# Patient Record
Sex: Male | Born: 1945 | Race: White | Hispanic: No | Marital: Single
Health system: Southern US, Community
[De-identification: ages and names within clinical notes are randomized; demographics above are authoritative.]

## PROBLEM LIST (undated history)

## (undated) DIAGNOSIS — C439 Malignant melanoma of skin, unspecified: Secondary | ICD-10-CM

## (undated) DIAGNOSIS — Z8719 Personal history of other diseases of the digestive system: Secondary | ICD-10-CM

## (undated) DIAGNOSIS — F319 Bipolar disorder, unspecified: Secondary | ICD-10-CM

## (undated) HISTORY — DX: Malignant melanoma of skin, unspecified: C43.9

## (undated) HISTORY — PX: CATARACT EXTRACTION: SUR2

## (undated) HISTORY — PX: KNEE SURGERY: SHX244

---

## 2006-04-10 ENCOUNTER — Encounter: Admission: RE | Admit: 2006-04-10 | Discharge: 2006-04-10 | Payer: Self-pay | Admitting: Family Medicine

## 2014-10-14 ENCOUNTER — Other Ambulatory Visit: Payer: Self-pay | Admitting: Family Medicine

## 2014-10-14 DIAGNOSIS — Z139 Encounter for screening, unspecified: Secondary | ICD-10-CM

## 2014-10-19 ENCOUNTER — Ambulatory Visit
Admission: RE | Admit: 2014-10-19 | Discharge: 2014-10-19 | Disposition: A | Discharge status: Home / Self Care | Payer: Medicare Other | Source: Ambulatory Visit | Attending: Family Medicine | Admitting: Family Medicine

## 2014-10-19 DIAGNOSIS — Z139 Encounter for screening, unspecified: Secondary | ICD-10-CM

## 2017-07-27 DIAGNOSIS — H02431 Paralytic ptosis of right eyelid: Secondary | ICD-10-CM | POA: Diagnosis not present

## 2017-07-27 DIAGNOSIS — H02432 Paralytic ptosis of left eyelid: Secondary | ICD-10-CM | POA: Diagnosis not present

## 2017-08-01 DIAGNOSIS — H02834 Dermatochalasis of left upper eyelid: Secondary | ICD-10-CM | POA: Diagnosis not present

## 2017-08-01 DIAGNOSIS — H02831 Dermatochalasis of right upper eyelid: Secondary | ICD-10-CM | POA: Diagnosis not present

## 2017-08-06 DIAGNOSIS — G4700 Insomnia, unspecified: Secondary | ICD-10-CM | POA: Diagnosis not present

## 2017-08-06 DIAGNOSIS — D509 Iron deficiency anemia, unspecified: Secondary | ICD-10-CM | POA: Diagnosis not present

## 2017-08-06 DIAGNOSIS — I10 Essential (primary) hypertension: Secondary | ICD-10-CM | POA: Diagnosis not present

## 2017-08-06 DIAGNOSIS — R69 Illness, unspecified: Secondary | ICD-10-CM | POA: Diagnosis not present

## 2017-08-06 DIAGNOSIS — E7800 Pure hypercholesterolemia, unspecified: Secondary | ICD-10-CM | POA: Diagnosis not present

## 2017-08-06 DIAGNOSIS — Z79899 Other long term (current) drug therapy: Secondary | ICD-10-CM | POA: Diagnosis not present

## 2017-08-29 DIAGNOSIS — Z01818 Encounter for other preprocedural examination: Secondary | ICD-10-CM | POA: Diagnosis not present

## 2017-08-29 DIAGNOSIS — I10 Essential (primary) hypertension: Secondary | ICD-10-CM | POA: Diagnosis not present

## 2017-08-29 DIAGNOSIS — Z419 Encounter for procedure for purposes other than remedying health state, unspecified: Secondary | ICD-10-CM | POA: Diagnosis not present

## 2017-09-03 DIAGNOSIS — H02831 Dermatochalasis of right upper eyelid: Secondary | ICD-10-CM | POA: Diagnosis not present

## 2017-09-03 DIAGNOSIS — H02834 Dermatochalasis of left upper eyelid: Secondary | ICD-10-CM | POA: Diagnosis not present

## 2017-09-03 DIAGNOSIS — H53453 Other localized visual field defect, bilateral: Secondary | ICD-10-CM | POA: Diagnosis not present

## 2017-10-03 DIAGNOSIS — H401131 Primary open-angle glaucoma, bilateral, mild stage: Secondary | ICD-10-CM | POA: Diagnosis not present

## 2017-12-27 DIAGNOSIS — R69 Illness, unspecified: Secondary | ICD-10-CM | POA: Diagnosis not present

## 2018-01-11 DIAGNOSIS — S80212A Abrasion, left knee, initial encounter: Secondary | ICD-10-CM | POA: Diagnosis not present

## 2018-01-15 DIAGNOSIS — M25562 Pain in left knee: Secondary | ICD-10-CM | POA: Diagnosis not present

## 2018-01-15 DIAGNOSIS — M170 Bilateral primary osteoarthritis of knee: Secondary | ICD-10-CM | POA: Diagnosis not present

## 2018-01-15 DIAGNOSIS — M1711 Unilateral primary osteoarthritis, right knee: Secondary | ICD-10-CM | POA: Diagnosis not present

## 2018-01-15 DIAGNOSIS — M25561 Pain in right knee: Secondary | ICD-10-CM | POA: Diagnosis not present

## 2018-01-15 DIAGNOSIS — S80212A Abrasion, left knee, initial encounter: Secondary | ICD-10-CM | POA: Diagnosis not present

## 2018-01-15 DIAGNOSIS — M1712 Unilateral primary osteoarthritis, left knee: Secondary | ICD-10-CM | POA: Diagnosis not present

## 2018-01-29 DIAGNOSIS — R296 Repeated falls: Secondary | ICD-10-CM | POA: Diagnosis not present

## 2018-01-29 DIAGNOSIS — G4700 Insomnia, unspecified: Secondary | ICD-10-CM | POA: Diagnosis not present

## 2018-02-06 DIAGNOSIS — H401131 Primary open-angle glaucoma, bilateral, mild stage: Secondary | ICD-10-CM | POA: Diagnosis not present

## 2018-02-12 DIAGNOSIS — E7800 Pure hypercholesterolemia, unspecified: Secondary | ICD-10-CM | POA: Diagnosis not present

## 2018-02-12 DIAGNOSIS — Z23 Encounter for immunization: Secondary | ICD-10-CM | POA: Diagnosis not present

## 2018-02-12 DIAGNOSIS — I10 Essential (primary) hypertension: Secondary | ICD-10-CM | POA: Diagnosis not present

## 2018-02-12 DIAGNOSIS — Z0001 Encounter for general adult medical examination with abnormal findings: Secondary | ICD-10-CM | POA: Diagnosis not present

## 2018-02-12 DIAGNOSIS — D509 Iron deficiency anemia, unspecified: Secondary | ICD-10-CM | POA: Diagnosis not present

## 2018-02-12 DIAGNOSIS — R55 Syncope and collapse: Secondary | ICD-10-CM | POA: Diagnosis not present

## 2018-02-12 DIAGNOSIS — G4700 Insomnia, unspecified: Secondary | ICD-10-CM | POA: Diagnosis not present

## 2018-02-13 ENCOUNTER — Other Ambulatory Visit (HOSPITAL_COMMUNITY): Payer: Self-pay | Admitting: Family Medicine

## 2018-02-13 DIAGNOSIS — R55 Syncope and collapse: Secondary | ICD-10-CM

## 2018-02-19 ENCOUNTER — Other Ambulatory Visit: Payer: Self-pay

## 2018-02-19 ENCOUNTER — Ambulatory Visit (HOSPITAL_COMMUNITY): Payer: Medicare HMO | Attending: Cardiovascular Disease

## 2018-02-19 DIAGNOSIS — I10 Essential (primary) hypertension: Secondary | ICD-10-CM | POA: Insufficient documentation

## 2018-02-19 DIAGNOSIS — R55 Syncope and collapse: Secondary | ICD-10-CM | POA: Diagnosis not present

## 2018-02-19 DIAGNOSIS — I340 Nonrheumatic mitral (valve) insufficiency: Secondary | ICD-10-CM | POA: Diagnosis not present

## 2018-02-19 DIAGNOSIS — E785 Hyperlipidemia, unspecified: Secondary | ICD-10-CM | POA: Diagnosis not present

## 2018-04-27 DIAGNOSIS — J069 Acute upper respiratory infection, unspecified: Secondary | ICD-10-CM | POA: Diagnosis not present

## 2018-06-26 DIAGNOSIS — H401131 Primary open-angle glaucoma, bilateral, mild stage: Secondary | ICD-10-CM | POA: Diagnosis not present

## 2018-07-29 DIAGNOSIS — R69 Illness, unspecified: Secondary | ICD-10-CM | POA: Diagnosis not present

## 2018-08-15 DIAGNOSIS — D509 Iron deficiency anemia, unspecified: Secondary | ICD-10-CM | POA: Diagnosis not present

## 2018-08-15 DIAGNOSIS — G4700 Insomnia, unspecified: Secondary | ICD-10-CM | POA: Diagnosis not present

## 2018-08-15 DIAGNOSIS — I10 Essential (primary) hypertension: Secondary | ICD-10-CM | POA: Diagnosis not present

## 2018-08-15 DIAGNOSIS — R69 Illness, unspecified: Secondary | ICD-10-CM | POA: Diagnosis not present

## 2018-08-15 DIAGNOSIS — E7800 Pure hypercholesterolemia, unspecified: Secondary | ICD-10-CM | POA: Diagnosis not present

## 2018-10-23 DIAGNOSIS — H401131 Primary open-angle glaucoma, bilateral, mild stage: Secondary | ICD-10-CM | POA: Diagnosis not present

## 2019-02-05 DIAGNOSIS — R69 Illness, unspecified: Secondary | ICD-10-CM | POA: Diagnosis not present

## 2019-02-27 DIAGNOSIS — Z0001 Encounter for general adult medical examination with abnormal findings: Secondary | ICD-10-CM | POA: Diagnosis not present

## 2019-02-27 DIAGNOSIS — I10 Essential (primary) hypertension: Secondary | ICD-10-CM | POA: Diagnosis not present

## 2019-02-27 DIAGNOSIS — R69 Illness, unspecified: Secondary | ICD-10-CM | POA: Diagnosis not present

## 2019-02-27 DIAGNOSIS — Z79899 Other long term (current) drug therapy: Secondary | ICD-10-CM | POA: Diagnosis not present

## 2019-02-27 DIAGNOSIS — Z113 Encounter for screening for infections with a predominantly sexual mode of transmission: Secondary | ICD-10-CM | POA: Diagnosis not present

## 2019-02-27 DIAGNOSIS — Z23 Encounter for immunization: Secondary | ICD-10-CM | POA: Diagnosis not present

## 2019-02-27 DIAGNOSIS — E7800 Pure hypercholesterolemia, unspecified: Secondary | ICD-10-CM | POA: Diagnosis not present

## 2019-02-27 DIAGNOSIS — G4700 Insomnia, unspecified: Secondary | ICD-10-CM | POA: Diagnosis not present

## 2019-03-07 ENCOUNTER — Other Ambulatory Visit: Payer: Self-pay | Admitting: *Deleted

## 2019-03-07 DIAGNOSIS — R6889 Other general symptoms and signs: Secondary | ICD-10-CM | POA: Diagnosis not present

## 2019-03-07 DIAGNOSIS — Z20822 Contact with and (suspected) exposure to covid-19: Secondary | ICD-10-CM

## 2019-03-08 LAB — NOVEL CORONAVIRUS, NAA: SARS-CoV-2, NAA: NOT DETECTED

## 2019-03-10 ENCOUNTER — Telehealth: Payer: Self-pay | Admitting: Family Medicine

## 2019-03-10 NOTE — Telephone Encounter (Signed)
Negative COVID results given. Patient results "NOT Detected." Caller expressed understanding. ° °

## 2019-03-25 DIAGNOSIS — H401131 Primary open-angle glaucoma, bilateral, mild stage: Secondary | ICD-10-CM | POA: Diagnosis not present

## 2019-03-25 DIAGNOSIS — Z961 Presence of intraocular lens: Secondary | ICD-10-CM | POA: Diagnosis not present

## 2019-04-21 DIAGNOSIS — H9202 Otalgia, left ear: Secondary | ICD-10-CM | POA: Diagnosis not present

## 2019-05-12 ENCOUNTER — Other Ambulatory Visit: Payer: Self-pay

## 2019-05-12 DIAGNOSIS — Z20822 Contact with and (suspected) exposure to covid-19: Secondary | ICD-10-CM

## 2019-05-13 LAB — NOVEL CORONAVIRUS, NAA: SARS-CoV-2, NAA: NOT DETECTED

## 2019-07-09 ENCOUNTER — Ambulatory Visit: Payer: Medicare Other | Attending: Internal Medicine

## 2019-07-09 DIAGNOSIS — Z23 Encounter for immunization: Secondary | ICD-10-CM | POA: Insufficient documentation

## 2019-07-09 NOTE — Progress Notes (Signed)
   Covid-19 Vaccination Clinic  Name:  Joseph Walls    MRN: [id number] DOB: 09-18-1945  07/09/2019  [name] was observed post Covid-19 immunization for 15 minutes without incidence. He was provided with Vaccine Information Sheet and instruction to access the V-Safe system.   [name] was instructed to call 911 with any severe reactions post vaccine: Marland Kitchen Difficulty breathing  . Swelling of your face and throat  . A fast heartbeat  . A bad rash all over your body  . Dizziness and weakness    Immunizations Administered    Name Date Dose VIS Date Route   Pfizer COVID-19 Vaccine 07/09/2019  2:09 PM 0.3 mL 05/30/2019 Intramuscular   Manufacturer: [employer], [employer]   Lot: [id number]   NDC: [address]-4

## 2019-07-29 DIAGNOSIS — H401131 Primary open-angle glaucoma, bilateral, mild stage: Secondary | ICD-10-CM | POA: Diagnosis not present

## 2019-07-30 ENCOUNTER — Ambulatory Visit: Payer: Medicare HMO | Attending: Internal Medicine

## 2019-07-30 DIAGNOSIS — Z23 Encounter for immunization: Secondary | ICD-10-CM | POA: Insufficient documentation

## 2019-07-30 NOTE — Progress Notes (Signed)
   Covid-19 Vaccination Clinic  Name:  Joseph Walls    MRN: [id number] DOB: [birth year]  07/30/2019  Joseph Walls was observed post Covid-19 immunization for 15 minutes without incidence. He was provided with Vaccine Information Sheet and instruction to access the V-Safe system.   Joseph Walls was instructed to call 911 with any severe reactions post vaccine: Marland Kitchen Difficulty breathing  . Swelling of your face and throat  . A fast heartbeat  . A bad rash all over your body  . Dizziness and weakness    Immunizations Administered    Name Date Dose VIS Date Route   Pfizer COVID-19 Vaccine 07/30/2019  2:12 PM 0.3 mL 05/30/2019 Intramuscular   Manufacturer: [employer], [employer]   Lot: [id number]   NDC: [address]-3

## 2019-08-13 DIAGNOSIS — R69 Illness, unspecified: Secondary | ICD-10-CM | POA: Diagnosis not present

## 2019-08-27 DIAGNOSIS — E7800 Pure hypercholesterolemia, unspecified: Secondary | ICD-10-CM | POA: Diagnosis not present

## 2019-08-27 DIAGNOSIS — R69 Illness, unspecified: Secondary | ICD-10-CM | POA: Diagnosis not present

## 2019-08-27 DIAGNOSIS — I10 Essential (primary) hypertension: Secondary | ICD-10-CM | POA: Diagnosis not present

## 2019-08-27 DIAGNOSIS — H00012 Hordeolum externum right lower eyelid: Secondary | ICD-10-CM | POA: Diagnosis not present

## 2019-08-27 DIAGNOSIS — G4700 Insomnia, unspecified: Secondary | ICD-10-CM | POA: Diagnosis not present

## 2019-08-27 DIAGNOSIS — D509 Iron deficiency anemia, unspecified: Secondary | ICD-10-CM | POA: Diagnosis not present

## 2019-09-03 DIAGNOSIS — Z1159 Encounter for screening for other viral diseases: Secondary | ICD-10-CM | POA: Diagnosis not present

## 2019-09-08 DIAGNOSIS — Z1211 Encounter for screening for malignant neoplasm of colon: Secondary | ICD-10-CM | POA: Diagnosis not present

## 2019-09-08 DIAGNOSIS — K5730 Diverticulosis of large intestine without perforation or abscess without bleeding: Secondary | ICD-10-CM | POA: Diagnosis not present

## 2019-09-18 DIAGNOSIS — M1712 Unilateral primary osteoarthritis, left knee: Secondary | ICD-10-CM | POA: Diagnosis not present

## 2019-10-08 DIAGNOSIS — H0100A Unspecified blepharitis right eye, upper and lower eyelids: Secondary | ICD-10-CM | POA: Diagnosis not present

## 2019-10-08 DIAGNOSIS — H0100B Unspecified blepharitis left eye, upper and lower eyelids: Secondary | ICD-10-CM | POA: Diagnosis not present

## 2019-10-08 DIAGNOSIS — H00011 Hordeolum externum right upper eyelid: Secondary | ICD-10-CM | POA: Diagnosis not present

## 2019-10-22 DIAGNOSIS — H0100A Unspecified blepharitis right eye, upper and lower eyelids: Secondary | ICD-10-CM | POA: Diagnosis not present

## 2019-11-14 DIAGNOSIS — S80219A Abrasion, unspecified knee, initial encounter: Secondary | ICD-10-CM | POA: Diagnosis not present

## 2019-12-11 DIAGNOSIS — H401131 Primary open-angle glaucoma, bilateral, mild stage: Secondary | ICD-10-CM | POA: Diagnosis not present

## 2020-02-18 DIAGNOSIS — E039 Hypothyroidism, unspecified: Secondary | ICD-10-CM | POA: Diagnosis not present

## 2020-02-18 DIAGNOSIS — T1511XA Foreign body in conjunctival sac, right eye, initial encounter: Secondary | ICD-10-CM | POA: Diagnosis not present

## 2020-02-18 DIAGNOSIS — R69 Illness, unspecified: Secondary | ICD-10-CM | POA: Diagnosis not present

## 2020-02-25 DIAGNOSIS — R69 Illness, unspecified: Secondary | ICD-10-CM | POA: Diagnosis not present

## 2020-03-08 DIAGNOSIS — I10 Essential (primary) hypertension: Secondary | ICD-10-CM | POA: Diagnosis not present

## 2020-03-08 DIAGNOSIS — R69 Illness, unspecified: Secondary | ICD-10-CM | POA: Diagnosis not present

## 2020-03-08 DIAGNOSIS — G4700 Insomnia, unspecified: Secondary | ICD-10-CM | POA: Diagnosis not present

## 2020-03-08 DIAGNOSIS — Z79899 Other long term (current) drug therapy: Secondary | ICD-10-CM | POA: Diagnosis not present

## 2020-03-08 DIAGNOSIS — Z0001 Encounter for general adult medical examination with abnormal findings: Secondary | ICD-10-CM | POA: Diagnosis not present

## 2020-03-08 DIAGNOSIS — E7800 Pure hypercholesterolemia, unspecified: Secondary | ICD-10-CM | POA: Diagnosis not present

## 2020-03-08 DIAGNOSIS — D509 Iron deficiency anemia, unspecified: Secondary | ICD-10-CM | POA: Diagnosis not present

## 2020-04-15 DIAGNOSIS — H401131 Primary open-angle glaucoma, bilateral, mild stage: Secondary | ICD-10-CM | POA: Diagnosis not present

## 2020-08-27 DIAGNOSIS — W228XXA Striking against or struck by other objects, initial encounter: Secondary | ICD-10-CM | POA: Diagnosis not present

## 2020-08-27 DIAGNOSIS — S50311A Abrasion of right elbow, initial encounter: Secondary | ICD-10-CM | POA: Diagnosis not present

## 2020-08-27 DIAGNOSIS — M25521 Pain in right elbow: Secondary | ICD-10-CM | POA: Diagnosis not present

## 2020-09-06 DIAGNOSIS — G4733 Obstructive sleep apnea (adult) (pediatric): Secondary | ICD-10-CM | POA: Diagnosis not present

## 2020-09-06 DIAGNOSIS — E7800 Pure hypercholesterolemia, unspecified: Secondary | ICD-10-CM | POA: Diagnosis not present

## 2020-09-06 DIAGNOSIS — R69 Illness, unspecified: Secondary | ICD-10-CM | POA: Diagnosis not present

## 2020-09-06 DIAGNOSIS — G4700 Insomnia, unspecified: Secondary | ICD-10-CM | POA: Diagnosis not present

## 2020-09-06 DIAGNOSIS — N5201 Erectile dysfunction due to arterial insufficiency: Secondary | ICD-10-CM | POA: Diagnosis not present

## 2020-09-06 DIAGNOSIS — I10 Essential (primary) hypertension: Secondary | ICD-10-CM | POA: Diagnosis not present

## 2020-09-07 DIAGNOSIS — H524 Presbyopia: Secondary | ICD-10-CM | POA: Diagnosis not present

## 2020-09-07 DIAGNOSIS — H401131 Primary open-angle glaucoma, bilateral, mild stage: Secondary | ICD-10-CM | POA: Diagnosis not present

## 2020-09-07 DIAGNOSIS — Z961 Presence of intraocular lens: Secondary | ICD-10-CM | POA: Diagnosis not present

## 2020-10-18 DIAGNOSIS — R399 Unspecified symptoms and signs involving the genitourinary system: Secondary | ICD-10-CM | POA: Diagnosis not present

## 2020-10-18 DIAGNOSIS — E538 Deficiency of other specified B group vitamins: Secondary | ICD-10-CM | POA: Diagnosis not present

## 2020-10-18 DIAGNOSIS — H8110 Benign paroxysmal vertigo, unspecified ear: Secondary | ICD-10-CM | POA: Diagnosis not present

## 2020-10-18 DIAGNOSIS — Z5181 Encounter for therapeutic drug level monitoring: Secondary | ICD-10-CM | POA: Diagnosis not present

## 2021-01-13 DIAGNOSIS — H401131 Primary open-angle glaucoma, bilateral, mild stage: Secondary | ICD-10-CM | POA: Diagnosis not present

## 2021-02-17 DIAGNOSIS — E7800 Pure hypercholesterolemia, unspecified: Secondary | ICD-10-CM | POA: Diagnosis not present

## 2021-02-17 DIAGNOSIS — Z5181 Encounter for therapeutic drug level monitoring: Secondary | ICD-10-CM | POA: Diagnosis not present

## 2021-02-17 DIAGNOSIS — E538 Deficiency of other specified B group vitamins: Secondary | ICD-10-CM | POA: Diagnosis not present

## 2021-03-04 DIAGNOSIS — Z23 Encounter for immunization: Secondary | ICD-10-CM | POA: Diagnosis not present

## 2021-03-24 DIAGNOSIS — G4700 Insomnia, unspecified: Secondary | ICD-10-CM | POA: Diagnosis not present

## 2021-03-24 DIAGNOSIS — D509 Iron deficiency anemia, unspecified: Secondary | ICD-10-CM | POA: Diagnosis not present

## 2021-03-24 DIAGNOSIS — N5201 Erectile dysfunction due to arterial insufficiency: Secondary | ICD-10-CM | POA: Diagnosis not present

## 2021-03-24 DIAGNOSIS — Z23 Encounter for immunization: Secondary | ICD-10-CM | POA: Diagnosis not present

## 2021-03-24 DIAGNOSIS — Z79899 Other long term (current) drug therapy: Secondary | ICD-10-CM | POA: Diagnosis not present

## 2021-03-24 DIAGNOSIS — Z0001 Encounter for general adult medical examination with abnormal findings: Secondary | ICD-10-CM | POA: Diagnosis not present

## 2021-03-24 DIAGNOSIS — I10 Essential (primary) hypertension: Secondary | ICD-10-CM | POA: Diagnosis not present

## 2021-03-24 DIAGNOSIS — R69 Illness, unspecified: Secondary | ICD-10-CM | POA: Diagnosis not present

## 2021-03-24 DIAGNOSIS — E7800 Pure hypercholesterolemia, unspecified: Secondary | ICD-10-CM | POA: Diagnosis not present

## 2021-05-19 DIAGNOSIS — H401131 Primary open-angle glaucoma, bilateral, mild stage: Secondary | ICD-10-CM | POA: Diagnosis not present

## 2021-05-19 DIAGNOSIS — Z961 Presence of intraocular lens: Secondary | ICD-10-CM | POA: Diagnosis not present

## 2021-06-30 DIAGNOSIS — M62830 Muscle spasm of back: Secondary | ICD-10-CM | POA: Diagnosis not present

## 2021-07-21 DIAGNOSIS — M25512 Pain in left shoulder: Secondary | ICD-10-CM | POA: Diagnosis not present

## 2021-07-21 DIAGNOSIS — M549 Dorsalgia, unspecified: Secondary | ICD-10-CM | POA: Diagnosis not present

## 2021-08-05 DIAGNOSIS — M25512 Pain in left shoulder: Secondary | ICD-10-CM | POA: Diagnosis not present

## 2021-08-17 DIAGNOSIS — M25612 Stiffness of left shoulder, not elsewhere classified: Secondary | ICD-10-CM | POA: Diagnosis not present

## 2021-08-17 DIAGNOSIS — M25512 Pain in left shoulder: Secondary | ICD-10-CM | POA: Diagnosis not present

## 2021-08-17 DIAGNOSIS — M542 Cervicalgia: Secondary | ICD-10-CM | POA: Diagnosis not present

## 2021-08-17 DIAGNOSIS — S46012D Strain of muscle(s) and tendon(s) of the rotator cuff of left shoulder, subsequent encounter: Secondary | ICD-10-CM | POA: Diagnosis not present

## 2021-08-17 DIAGNOSIS — M6281 Muscle weakness (generalized): Secondary | ICD-10-CM | POA: Diagnosis not present

## 2021-08-24 ENCOUNTER — Ambulatory Visit: Payer: Medicare HMO | Admitting: Physical Therapy

## 2021-08-26 DIAGNOSIS — S46012D Strain of muscle(s) and tendon(s) of the rotator cuff of left shoulder, subsequent encounter: Secondary | ICD-10-CM | POA: Diagnosis not present

## 2021-08-26 DIAGNOSIS — M542 Cervicalgia: Secondary | ICD-10-CM | POA: Diagnosis not present

## 2021-08-26 DIAGNOSIS — M6281 Muscle weakness (generalized): Secondary | ICD-10-CM | POA: Diagnosis not present

## 2021-08-26 DIAGNOSIS — M25512 Pain in left shoulder: Secondary | ICD-10-CM | POA: Diagnosis not present

## 2021-08-26 DIAGNOSIS — M25612 Stiffness of left shoulder, not elsewhere classified: Secondary | ICD-10-CM | POA: Diagnosis not present

## 2021-08-29 ENCOUNTER — Ambulatory Visit: Payer: Medicare HMO | Admitting: Physical Therapy

## 2021-08-31 DIAGNOSIS — M542 Cervicalgia: Secondary | ICD-10-CM | POA: Diagnosis not present

## 2021-08-31 DIAGNOSIS — M6281 Muscle weakness (generalized): Secondary | ICD-10-CM | POA: Diagnosis not present

## 2021-08-31 DIAGNOSIS — M25512 Pain in left shoulder: Secondary | ICD-10-CM | POA: Diagnosis not present

## 2021-08-31 DIAGNOSIS — M25612 Stiffness of left shoulder, not elsewhere classified: Secondary | ICD-10-CM | POA: Diagnosis not present

## 2021-08-31 DIAGNOSIS — S46012D Strain of muscle(s) and tendon(s) of the rotator cuff of left shoulder, subsequent encounter: Secondary | ICD-10-CM | POA: Diagnosis not present

## 2021-09-01 ENCOUNTER — Encounter: Payer: Medicare HMO | Admitting: Physical Therapy

## 2021-09-02 ENCOUNTER — Other Ambulatory Visit: Payer: Self-pay | Admitting: Sports Medicine

## 2021-09-02 DIAGNOSIS — M542 Cervicalgia: Secondary | ICD-10-CM

## 2021-09-02 DIAGNOSIS — M898X1 Other specified disorders of bone, shoulder: Secondary | ICD-10-CM | POA: Diagnosis not present

## 2021-09-06 ENCOUNTER — Encounter: Payer: Medicare HMO | Admitting: Physical Therapy

## 2021-09-09 ENCOUNTER — Encounter: Payer: Medicare HMO | Admitting: Physical Therapy

## 2021-09-13 DIAGNOSIS — M6281 Muscle weakness (generalized): Secondary | ICD-10-CM | POA: Diagnosis not present

## 2021-09-13 DIAGNOSIS — M25512 Pain in left shoulder: Secondary | ICD-10-CM | POA: Diagnosis not present

## 2021-09-13 DIAGNOSIS — M25612 Stiffness of left shoulder, not elsewhere classified: Secondary | ICD-10-CM | POA: Diagnosis not present

## 2021-09-13 DIAGNOSIS — S46012D Strain of muscle(s) and tendon(s) of the rotator cuff of left shoulder, subsequent encounter: Secondary | ICD-10-CM | POA: Diagnosis not present

## 2021-09-13 DIAGNOSIS — M542 Cervicalgia: Secondary | ICD-10-CM | POA: Diagnosis not present

## 2021-09-20 ENCOUNTER — Encounter: Payer: Medicare HMO | Admitting: Physical Therapy

## 2021-09-20 DIAGNOSIS — H401131 Primary open-angle glaucoma, bilateral, mild stage: Secondary | ICD-10-CM | POA: Diagnosis not present

## 2021-09-21 DIAGNOSIS — M25512 Pain in left shoulder: Secondary | ICD-10-CM | POA: Diagnosis not present

## 2021-09-21 DIAGNOSIS — M25612 Stiffness of left shoulder, not elsewhere classified: Secondary | ICD-10-CM | POA: Diagnosis not present

## 2021-09-21 DIAGNOSIS — M542 Cervicalgia: Secondary | ICD-10-CM | POA: Diagnosis not present

## 2021-09-21 DIAGNOSIS — M6281 Muscle weakness (generalized): Secondary | ICD-10-CM | POA: Diagnosis not present

## 2021-09-21 DIAGNOSIS — S46012D Strain of muscle(s) and tendon(s) of the rotator cuff of left shoulder, subsequent encounter: Secondary | ICD-10-CM | POA: Diagnosis not present

## 2021-09-22 DIAGNOSIS — R69 Illness, unspecified: Secondary | ICD-10-CM | POA: Diagnosis not present

## 2021-09-22 DIAGNOSIS — I10 Essential (primary) hypertension: Secondary | ICD-10-CM | POA: Diagnosis not present

## 2021-09-22 DIAGNOSIS — E7800 Pure hypercholesterolemia, unspecified: Secondary | ICD-10-CM | POA: Diagnosis not present

## 2021-09-23 ENCOUNTER — Ambulatory Visit
Admission: RE | Admit: 2021-09-23 | Discharge: 2021-09-23 | Disposition: A | Discharge status: Home / Self Care | Payer: Medicare HMO | Source: Ambulatory Visit | Attending: Sports Medicine | Admitting: Sports Medicine

## 2021-09-23 DIAGNOSIS — M2578 Osteophyte, vertebrae: Secondary | ICD-10-CM | POA: Diagnosis not present

## 2021-09-23 DIAGNOSIS — M542 Cervicalgia: Secondary | ICD-10-CM

## 2021-09-23 DIAGNOSIS — M4802 Spinal stenosis, cervical region: Secondary | ICD-10-CM | POA: Diagnosis not present

## 2021-11-06 DIAGNOSIS — L255 Unspecified contact dermatitis due to plants, except food: Secondary | ICD-10-CM | POA: Diagnosis not present

## 2021-11-10 DIAGNOSIS — L237 Allergic contact dermatitis due to plants, except food: Secondary | ICD-10-CM | POA: Diagnosis not present

## 2022-01-26 DIAGNOSIS — H43813 Vitreous degeneration, bilateral: Secondary | ICD-10-CM | POA: Diagnosis not present

## 2022-01-26 DIAGNOSIS — H04123 Dry eye syndrome of bilateral lacrimal glands: Secondary | ICD-10-CM | POA: Diagnosis not present

## 2022-01-26 DIAGNOSIS — H5213 Myopia, bilateral: Secondary | ICD-10-CM | POA: Diagnosis not present

## 2022-01-26 DIAGNOSIS — H401131 Primary open-angle glaucoma, bilateral, mild stage: Secondary | ICD-10-CM | POA: Diagnosis not present

## 2022-01-27 DIAGNOSIS — R69 Illness, unspecified: Secondary | ICD-10-CM | POA: Diagnosis not present

## 2022-01-27 DIAGNOSIS — R300 Dysuria: Secondary | ICD-10-CM | POA: Diagnosis not present

## 2022-04-12 DIAGNOSIS — E559 Vitamin D deficiency, unspecified: Secondary | ICD-10-CM | POA: Diagnosis not present

## 2022-05-03 DIAGNOSIS — M545 Low back pain, unspecified: Secondary | ICD-10-CM | POA: Diagnosis not present

## 2022-05-15 DIAGNOSIS — E7800 Pure hypercholesterolemia, unspecified: Secondary | ICD-10-CM | POA: Diagnosis not present

## 2022-05-15 DIAGNOSIS — D509 Iron deficiency anemia, unspecified: Secondary | ICD-10-CM | POA: Diagnosis not present

## 2022-05-15 DIAGNOSIS — I10 Essential (primary) hypertension: Secondary | ICD-10-CM | POA: Diagnosis not present

## 2022-05-15 DIAGNOSIS — R7301 Impaired fasting glucose: Secondary | ICD-10-CM | POA: Diagnosis not present

## 2022-05-15 DIAGNOSIS — R69 Illness, unspecified: Secondary | ICD-10-CM | POA: Diagnosis not present

## 2022-05-15 DIAGNOSIS — Z5181 Encounter for therapeutic drug level monitoring: Secondary | ICD-10-CM | POA: Diagnosis not present

## 2022-05-15 DIAGNOSIS — Z0001 Encounter for general adult medical examination with abnormal findings: Secondary | ICD-10-CM | POA: Diagnosis not present

## 2022-05-15 DIAGNOSIS — G4700 Insomnia, unspecified: Secondary | ICD-10-CM | POA: Diagnosis not present

## 2022-05-15 DIAGNOSIS — N5201 Erectile dysfunction due to arterial insufficiency: Secondary | ICD-10-CM | POA: Diagnosis not present

## 2022-05-22 DIAGNOSIS — H401131 Primary open-angle glaucoma, bilateral, mild stage: Secondary | ICD-10-CM | POA: Diagnosis not present

## 2022-05-22 DIAGNOSIS — H0100B Unspecified blepharitis left eye, upper and lower eyelids: Secondary | ICD-10-CM | POA: Diagnosis not present

## 2022-05-22 DIAGNOSIS — H0100A Unspecified blepharitis right eye, upper and lower eyelids: Secondary | ICD-10-CM | POA: Diagnosis not present

## 2022-06-27 ENCOUNTER — Other Ambulatory Visit (HOSPITAL_COMMUNITY): Payer: Self-pay | Admitting: Family Medicine

## 2022-06-27 DIAGNOSIS — R519 Headache, unspecified: Secondary | ICD-10-CM

## 2022-07-01 ENCOUNTER — Ambulatory Visit (HOSPITAL_BASED_OUTPATIENT_CLINIC_OR_DEPARTMENT_OTHER): Payer: Medicare HMO

## 2022-07-08 ENCOUNTER — Ambulatory Visit (HOSPITAL_BASED_OUTPATIENT_CLINIC_OR_DEPARTMENT_OTHER)
Admission: RE | Admit: 2022-07-08 | Discharge: 2022-07-08 | Disposition: A | Discharge status: Home / Self Care | Payer: Medicare HMO | Source: Ambulatory Visit | Attending: Family Medicine | Admitting: Family Medicine

## 2022-07-08 DIAGNOSIS — R519 Headache, unspecified: Secondary | ICD-10-CM | POA: Insufficient documentation

## 2022-08-27 DIAGNOSIS — U071 COVID-19: Secondary | ICD-10-CM | POA: Diagnosis not present

## 2022-09-25 DIAGNOSIS — H401131 Primary open-angle glaucoma, bilateral, mild stage: Secondary | ICD-10-CM | POA: Diagnosis not present

## 2022-09-25 DIAGNOSIS — H0100A Unspecified blepharitis right eye, upper and lower eyelids: Secondary | ICD-10-CM | POA: Diagnosis not present

## 2022-09-25 DIAGNOSIS — H0100B Unspecified blepharitis left eye, upper and lower eyelids: Secondary | ICD-10-CM | POA: Diagnosis not present

## 2022-10-10 IMAGING — MR MR CERVICAL SPINE W/O CM
5 series · 29 of 48 positions shown · non-contrast
Comparison: None.

CLINICAL DATA: Neck pain at the base of neck, radiating to left
scapula

EXAM:
MRI CERVICAL SPINE WITHOUT CONTRAST
TECHNIQUE: Multiplanar, multisequence MR imaging of the cervical spine was
performed. No intravenous contrast was administered.

[Series 3: T2 · sagittal · 3.0mm · 0.82mm/px · 7 of 19 slices shown (1 of 2)]
[im 1/19]
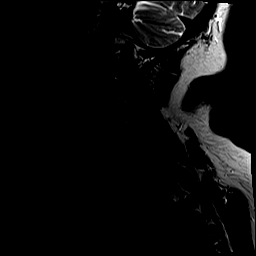
[im 4/19]
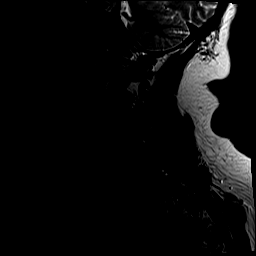
[im 7/19]
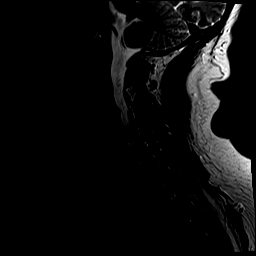
[im 10/19]
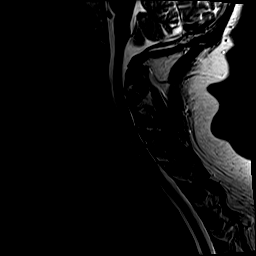
[im 13/19]
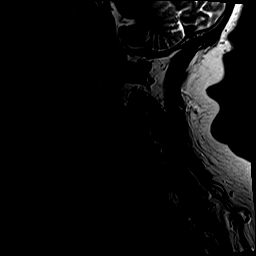
[im 16/19]
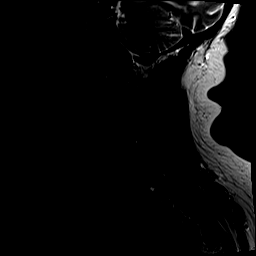
[im 19/19]
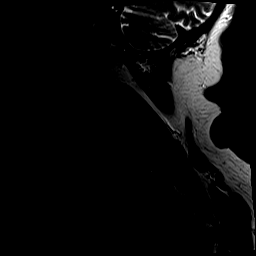

[Series 4: T1 · sagittal · 3.0mm · 0.41mm/px · 6 of 19 slices shown]
[im 1/19]
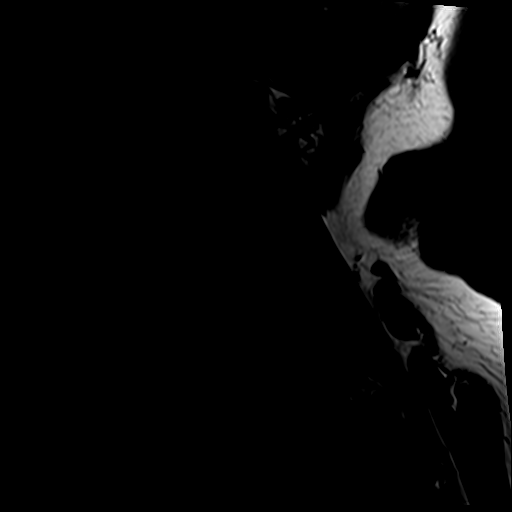
[im 4/19]
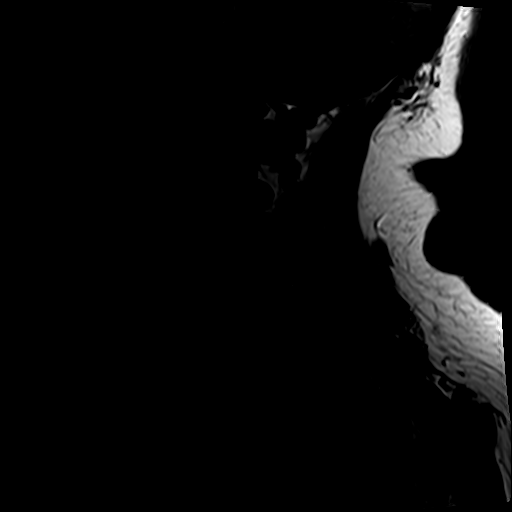
[im 8/19]
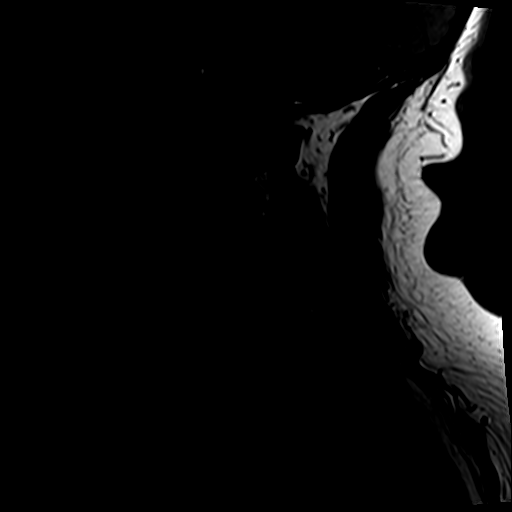
[im 11/19]
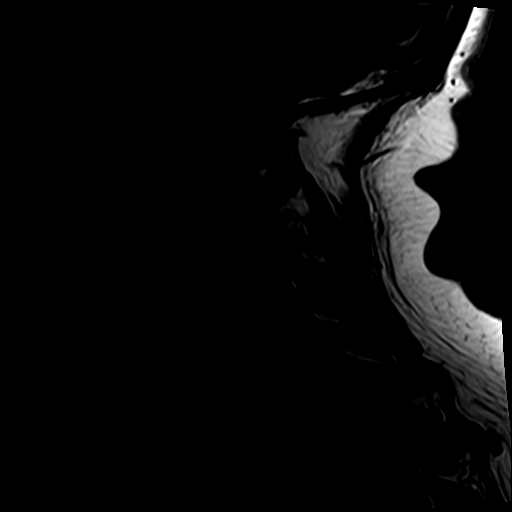
[im 15/19]
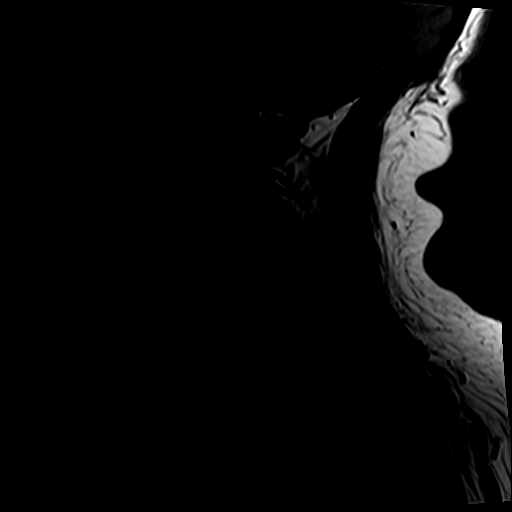
[im 19/19]
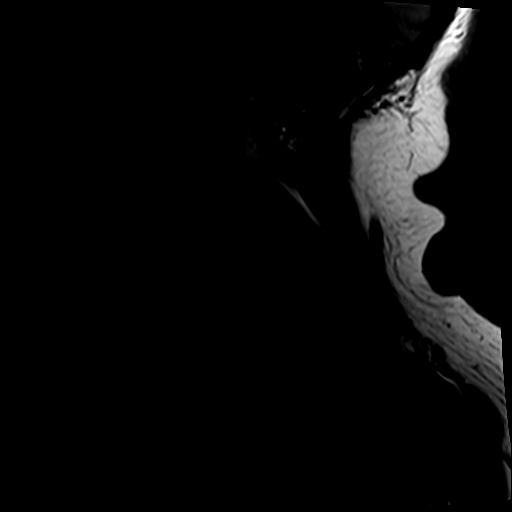

[Series 5: tir sag · sagittal · 3.0mm · 0.43mm/px · 6 of 19 slices shown]
[im 1/19]
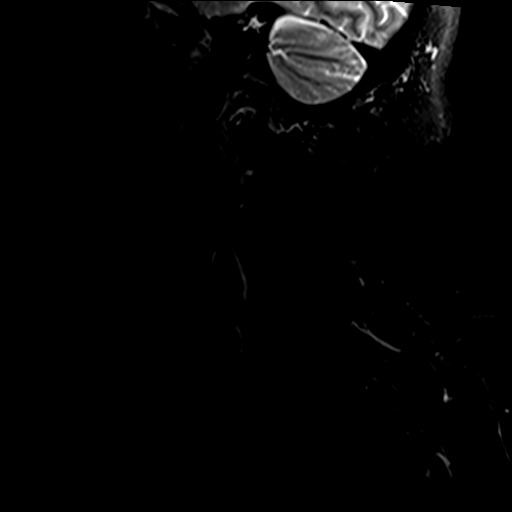
[im 4/19]
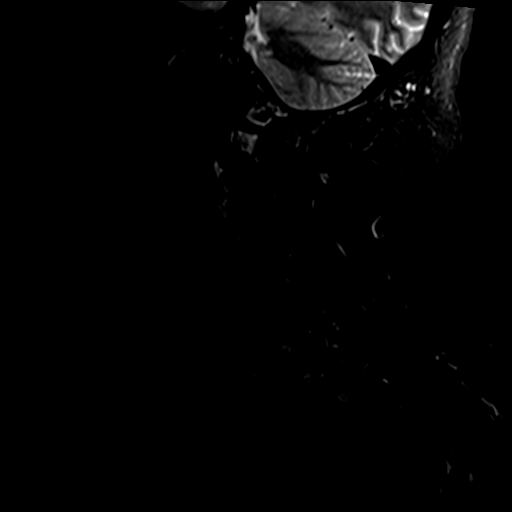
[im 8/19]
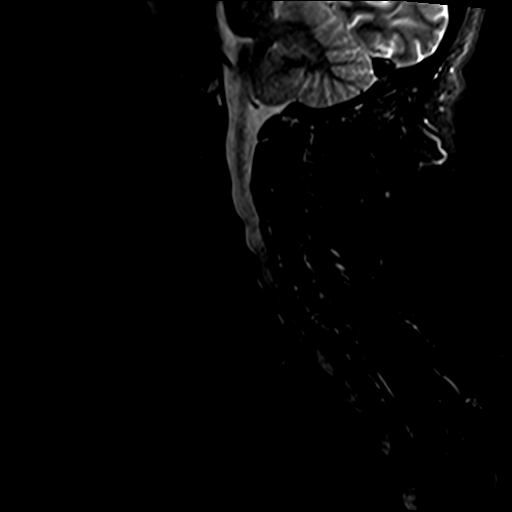
[im 11/19]
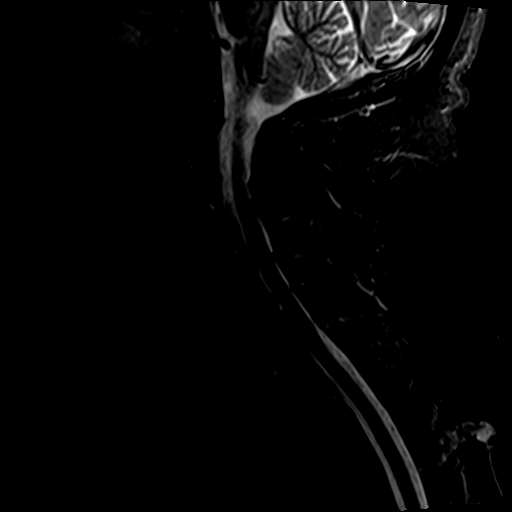
[im 15/19]
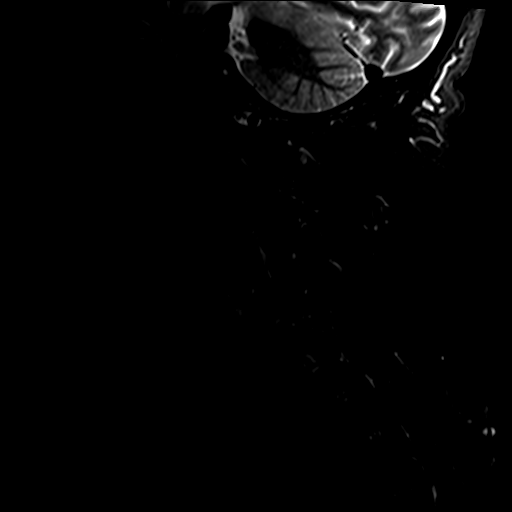
[im 19/19]
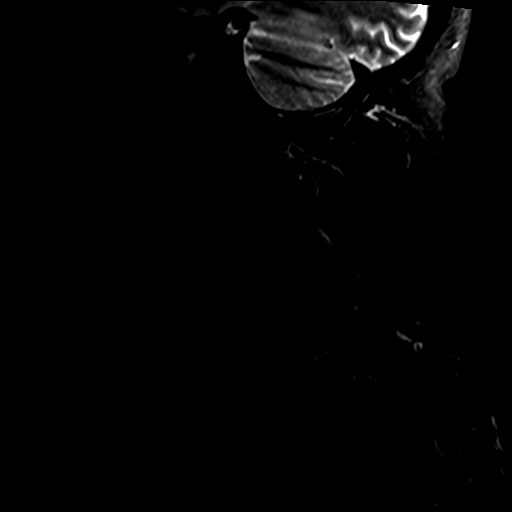

[Series 6: GRE · axial · 3.0mm · 0.39mm/px · z∈[-143,-122]mm · 2 of 44 slices shown]
[im 1/44]
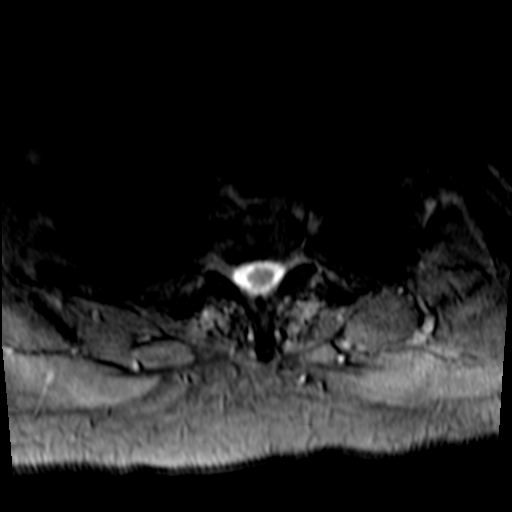
[im 7/44]
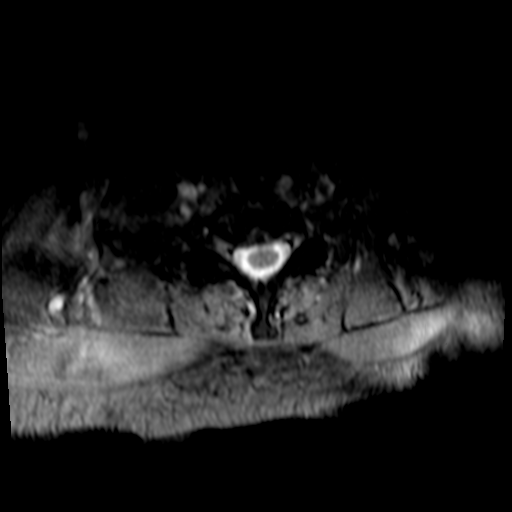

[Series 7: T2 · axial · 3.0mm · 0.78mm/px · z∈[-143,+9]mm · 8 of 43 slices shown (2 of 2)]
[im 1/43]
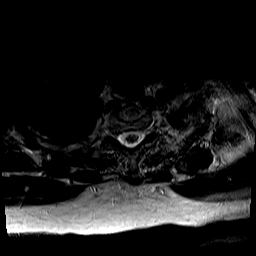
[im 7/43]
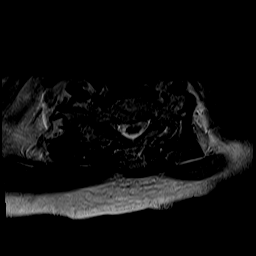
[im 13/43]
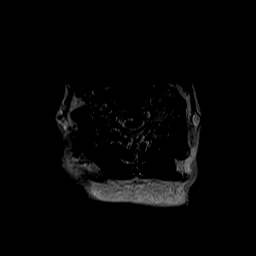
[im 20/43]
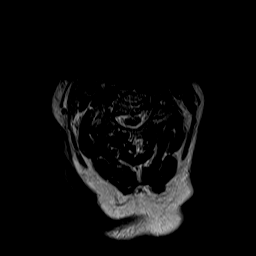
[im 23/43]
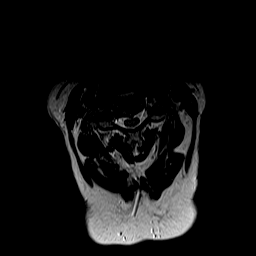
[im 30/43]
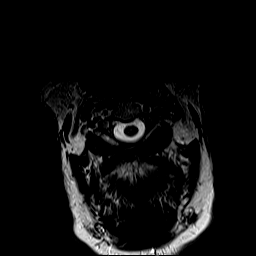
[im 36/43]
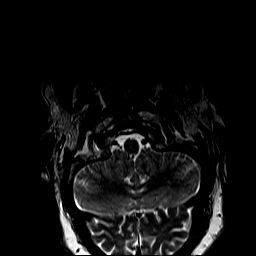
[im 43/43]
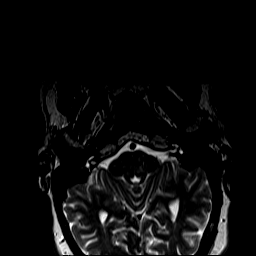

[29 of 48 positions shown; findings below may reference images not displayed]

FINDINGS: Evaluation is somewhat limited by motion artifact.

Alignment: No significant listhesis.

Vertebrae: No acute fracture or suspicious osseous lesion.
Congenitally short pedicles, which narrow the AP diameter of the
spinal canal.

Cord: Possible area of focally increased T2 signal at C4-C5 (series
7, image 25), although evaluation is somewhat limited by motion
artifact, and this increased T2 signal could be artifactual.
Otherwise normal in caliber and signal.

Posterior Fossa, vertebral arteries, paraspinal tissues: Negative.

Disc levels:

C2-C3: No significant disc bulge. Right-greater-than-left facet
arthropathy. No spinal canal stenosis or neuroforaminal narrowing.

C3-C4: Small central disc protrusion, which abuts the ventral cord.
Right-greater-than-left facet arthropathy and uncovertebral
hypertrophy. No spinal canal stenosis or neural foraminal narrowing.

C4-C5: Mild disc bulge, which abuts the ventral spinal cord.
Ligamentum flavum hypertrophy. Moderate to severe spinal canal
stenosis. Facet and uncovertebral hypertrophy. Mild left and
moderate right neural foraminal narrowing.

C5-C6: Disc osteophyte complex, which abuts the ventral cord.
Moderate spinal canal stenosis. Uncovertebral and facet arthropathy.
Severe bilateral neural foraminal narrowing.

C6-C7: Moderate disc bulge. Facet and uncovertebral hypertrophy.
Moderate spinal canal stenosis. Severe left-greater-than-right
neural foraminal narrowing.

C7-T1: No significant disc bulge. No spinal canal stenosis or neural
foraminal narrowing.
IMPRESSION: 1. Evaluation is somewhat limited by motion artifact. Within this
limitation, there is a possible area of focally increased T2 signal
at C4-C5, where there is also moderate to severe spinal canal
stenosis, possibly reflecting myelomalacia, although this may be
artifactual. In addition, there is mild left and moderate right
neural foraminal narrowing at this level.
2. C5-C6 and C6-C7 moderate spinal canal stenosis and severe
bilateral neural foraminal narrowing.

## 2022-11-22 ENCOUNTER — Encounter: Payer: Self-pay | Admitting: Family Medicine

## 2022-11-22 DIAGNOSIS — I10 Essential (primary) hypertension: Secondary | ICD-10-CM | POA: Diagnosis not present

## 2022-11-22 DIAGNOSIS — I77811 Abdominal aortic ectasia: Secondary | ICD-10-CM | POA: Diagnosis not present

## 2022-11-22 DIAGNOSIS — R7301 Impaired fasting glucose: Secondary | ICD-10-CM | POA: Diagnosis not present

## 2022-11-22 DIAGNOSIS — Z5181 Encounter for therapeutic drug level monitoring: Secondary | ICD-10-CM | POA: Diagnosis not present

## 2022-11-22 DIAGNOSIS — F3130 Bipolar disorder, current episode depressed, mild or moderate severity, unspecified: Secondary | ICD-10-CM | POA: Diagnosis not present

## 2022-11-22 DIAGNOSIS — D509 Iron deficiency anemia, unspecified: Secondary | ICD-10-CM | POA: Diagnosis not present

## 2022-11-22 DIAGNOSIS — G4700 Insomnia, unspecified: Secondary | ICD-10-CM | POA: Diagnosis not present

## 2022-12-05 ENCOUNTER — Other Ambulatory Visit: Payer: Self-pay | Admitting: Family Medicine

## 2022-12-05 DIAGNOSIS — I77811 Abdominal aortic ectasia: Secondary | ICD-10-CM

## 2022-12-12 ENCOUNTER — Other Ambulatory Visit: Payer: Self-pay | Admitting: Family Medicine

## 2022-12-12 ENCOUNTER — Ambulatory Visit
Admission: RE | Admit: 2022-12-12 | Discharge: 2022-12-12 | Disposition: A | Discharge status: Home / Self Care | Payer: Medicare HMO | Source: Ambulatory Visit | Attending: Family Medicine | Admitting: Family Medicine

## 2022-12-12 DIAGNOSIS — I77811 Abdominal aortic ectasia: Secondary | ICD-10-CM

## 2023-01-24 DIAGNOSIS — H0100A Unspecified blepharitis right eye, upper and lower eyelids: Secondary | ICD-10-CM | POA: Diagnosis not present

## 2023-01-24 DIAGNOSIS — H401131 Primary open-angle glaucoma, bilateral, mild stage: Secondary | ICD-10-CM | POA: Diagnosis not present

## 2023-01-24 DIAGNOSIS — H43813 Vitreous degeneration, bilateral: Secondary | ICD-10-CM | POA: Diagnosis not present

## 2023-01-24 DIAGNOSIS — H0100B Unspecified blepharitis left eye, upper and lower eyelids: Secondary | ICD-10-CM | POA: Diagnosis not present

## 2023-02-12 DIAGNOSIS — I10 Essential (primary) hypertension: Secondary | ICD-10-CM | POA: Diagnosis not present

## 2023-02-12 DIAGNOSIS — K5909 Other constipation: Secondary | ICD-10-CM | POA: Diagnosis not present

## 2023-04-17 ENCOUNTER — Telehealth (HOSPITAL_COMMUNITY): Payer: Self-pay

## 2023-04-17 NOTE — Telephone Encounter (Signed)
Con't from previous message on this date.  Alonza Bogus from [facility] sends a ROI on this consumer.  Remigio Eisenmenger, MS., LMFT, LCAS 04-16-23

## 2023-04-17 NOTE — Telephone Encounter (Signed)
Joseph Walls, [facility] emails this therapist asking for an update on behavioral services. Therapist emails her back with the following Joseph Walls has no involvement with behavioral health.  Joseph Walls send a ROI. Joseph Eisenmenger, LMFT. LCAS

## 2023-04-24 ENCOUNTER — Ambulatory Visit: Payer: Medicare HMO | Admitting: Dermatology

## 2023-04-24 ENCOUNTER — Encounter: Payer: Self-pay | Admitting: Dermatology

## 2023-04-24 VITALS — BP 134/81 | HR 70

## 2023-04-24 DIAGNOSIS — C44519 Basal cell carcinoma of skin of other part of trunk: Secondary | ICD-10-CM

## 2023-04-24 DIAGNOSIS — L570 Actinic keratosis: Secondary | ICD-10-CM | POA: Diagnosis not present

## 2023-04-24 DIAGNOSIS — C44319 Basal cell carcinoma of skin of other parts of face: Secondary | ICD-10-CM

## 2023-04-24 DIAGNOSIS — D492 Neoplasm of unspecified behavior of bone, soft tissue, and skin: Secondary | ICD-10-CM

## 2023-04-24 DIAGNOSIS — L814 Other melanin hyperpigmentation: Secondary | ICD-10-CM | POA: Diagnosis not present

## 2023-04-24 DIAGNOSIS — D1801 Hemangioma of skin and subcutaneous tissue: Secondary | ICD-10-CM

## 2023-04-24 DIAGNOSIS — W908XXA Exposure to other nonionizing radiation, initial encounter: Secondary | ICD-10-CM | POA: Diagnosis not present

## 2023-04-24 DIAGNOSIS — C44311 Basal cell carcinoma of skin of nose: Secondary | ICD-10-CM | POA: Diagnosis not present

## 2023-04-24 DIAGNOSIS — Z1283 Encounter for screening for malignant neoplasm of skin: Secondary | ICD-10-CM

## 2023-04-24 DIAGNOSIS — L821 Other seborrheic keratosis: Secondary | ICD-10-CM

## 2023-04-24 DIAGNOSIS — L820 Inflamed seborrheic keratosis: Secondary | ICD-10-CM

## 2023-04-24 DIAGNOSIS — D485 Neoplasm of uncertain behavior of skin: Secondary | ICD-10-CM

## 2023-04-24 DIAGNOSIS — D229 Melanocytic nevi, unspecified: Secondary | ICD-10-CM

## 2023-04-24 DIAGNOSIS — L578 Other skin changes due to chronic exposure to nonionizing radiation: Secondary | ICD-10-CM

## 2023-04-24 DIAGNOSIS — Z85820 Personal history of malignant melanoma of skin: Secondary | ICD-10-CM

## 2023-04-24 NOTE — Progress Notes (Signed)
New Patient Visit   Subjective  Joseph Walls is a 77 y.o. male who presents for the following: Skin Cancer Screening and Upper Body Skin Exam  The patient presents for Upper Body Skin Exam (UBSE) for skin cancer screening and mole check. The patient has spots, moles and lesions to be evaluated, some may be new or changing. Pt has hx of MM on lower back around 3 years ago.   The following portions of the chart were reviewed this encounter and updated as appropriate: medications, allergies, medical history  Review of Systems:  No other skin or systemic complaints except as noted in HPI or Assessment and Plan.  Objective  Well appearing patient in no apparent distress; mood and affect are within normal limits.  All skin waist up examined. Relevant physical exam findings are noted in the Assessment and Plan.  Left nasal ala 1.0 x 1.2 cm ulcerated hyperkeratotic papule     Right lower cheek 0.9 x 0.5 cm pink pearly papule     Right chest 1.2 x 1.2 cm atrophic pink pearly plaque       Left Forehead Erythematous thin papules/macules with gritty scale.   Right chest Erythematous stuck-on, waxy papule or plaque   No cervical, axillary or inguinal lymphadenopathy.  Assessment & Plan   HISTORY OF MELANOMA- Left mid back - No evidence of recurrence today - No lymphadenopathy - Recommend regular full body skin exams - Recommend daily broad spectrum sunscreen SPF 30+ to sun-exposed areas, reapply every 2 hours as needed.  - Call if any new or changing lesions are noted between office visits  Neoplasm of uncertain behavior of skin (3) Left nasal ala  Skin / nail biopsy Type of biopsy: tangential   Informed consent: discussed and consent obtained   Timeout: patient name, date of birth, surgical site, and procedure verified   Procedure prep:  Patient was prepped and draped in usual sterile fashion Prep type:  Isopropyl alcohol Anesthesia: the lesion was  anesthetized in a standard fashion   Anesthetic:  1% lidocaine w/ epinephrine 1-100,000 buffered w/ 8.4% NaHCO3 Instrument used: flexible razor blade   Hemostasis achieved with: pressure, aluminum chloride and electrodesiccation   Outcome: patient tolerated procedure well   Post-procedure details: sterile dressing applied and wound care instructions given   Dressing type: bandage and petrolatum    Specimen 1 - Surgical pathology Differential Diagnosis: R/O NMSC  Check Margins: No  Right lower cheek  Skin / nail biopsy Type of biopsy: tangential   Informed consent: discussed and consent obtained   Timeout: patient name, date of birth, surgical site, and procedure verified   Procedure prep:  Patient was prepped and draped in usual sterile fashion Prep type:  Isopropyl alcohol Anesthesia: the lesion was anesthetized in a standard fashion   Anesthetic:  1% lidocaine w/ epinephrine 1-100,000 buffered w/ 8.4% NaHCO3 Instrument used: flexible razor blade   Hemostasis achieved with: pressure, aluminum chloride and electrodesiccation   Outcome: patient tolerated procedure well   Post-procedure details: sterile dressing applied and wound care instructions given   Dressing type: bandage and petrolatum    Specimen 2 - Surgical pathology Differential Diagnosis: R/O BCC  Check Margins: No  Right chest  Skin / nail biopsy Type of biopsy: tangential   Informed consent: discussed and consent obtained   Timeout: patient name, date of birth, surgical site, and procedure verified   Procedure prep:  Patient was prepped and draped in usual sterile fashion Prep type:  Isopropyl alcohol Anesthesia: the lesion was anesthetized in a standard fashion   Anesthetic:  1% lidocaine w/ epinephrine 1-100,000 buffered w/ 8.4% NaHCO3 Instrument used: flexible razor blade   Hemostasis achieved with: pressure, aluminum chloride and electrodesiccation   Outcome: patient tolerated procedure well    Post-procedure details: sterile dressing applied and wound care instructions given   Dressing type: bandage and petrolatum    Specimen 3 - Surgical pathology Differential Diagnosis: R/O BCC vs Amelanotic MM  Check Margins: No  AK (actinic keratosis) Left Forehead  Destruction of lesion - Left Forehead Complexity: simple   Destruction method: cryotherapy   Informed consent: discussed and consent obtained   Timeout:  patient name, date of birth, surgical site, and procedure verified Lesion destroyed using liquid nitrogen: Yes   Region frozen until ice ball extended beyond lesion: Yes   Outcome: patient tolerated procedure well with no complications   Post-procedure details: wound care instructions given    Inflamed seborrheic keratosis Right chest  Symptomatic, irritating, patient would like treated.  Benign-appearing.  Call clinic for new or changing lesions.    Destruction of lesion - Right chest Complexity: simple   Destruction method: cryotherapy   Informed consent: discussed and consent obtained   Timeout:  patient name, date of birth, surgical site, and procedure verified Lesion destroyed using liquid nitrogen: Yes   Region frozen until ice ball extended beyond lesion: Yes   Outcome: patient tolerated procedure well with no complications   Post-procedure details: wound care instructions given     Skin cancer screening performed today.  Actinic Damage - Chronic condition, secondary to cumulative UV/sun exposure - diffuse scaly erythematous macules with underlying dyspigmentation - Recommend daily broad spectrum sunscreen SPF 30+ to sun-exposed areas, reapply every 2 hours as needed.  - Staying in the shade or wearing long sleeves, sun glasses (UVA+UVB protection) and wide brim hats (4-inch brim around the entire circumference of the hat) are also recommended for sun protection.  - Call for new or changing lesions.  Melanocytic Nevi - Tan-brown and/or  pink-flesh-colored symmetric macules and papules - Benign appearing on exam today - Observation - Call clinic for new or changing moles - Recommend daily use of broad spectrum spf 30+ sunscreen to sun-exposed areas.   SEBORRHEIC KERATOSIS - Stuck-on, waxy, tan-brown papules and/or plaques  - Benign-appearing - Discussed benign etiology and prognosis. - Observe - Call for any changes  LENTIGINES Exam: scattered tan macules Due to sun exposure Treatment Plan: Benign-appearing, observe. Recommend daily broad spectrum sunscreen SPF 30+ to sun-exposed areas, reapply every 2 hours as needed.  Call for any changes    HEMANGIOMA Exam: red papule(s) Discussed benign nature. Recommend observation. Call for changes.   Return in about 6 months (around 10/22/2023) for UBSE.  I, Joanie Coddington, CMA, am acting as scribe for Gwenith Daily, MD .   Documentation: I have reviewed the above documentation for accuracy and completeness, and I agree with the above.  Gwenith Daily, MD

## 2023-04-24 NOTE — Patient Instructions (Addendum)
Cryotherapy Aftercare  Wash gently with soap and water everyday.   Apply Vaseline and Band-Aid daily until healed.   Patient Handout: Wound Care for Skin Biopsy Site  Taking Care of Your Skin Biopsy Site  Proper care of the biopsy site is essential for promoting healing and minimizing scarring. This handout provides instructions on how to care for your biopsy site to ensure optimal recovery.  1. Cleaning the Wound:  Clean the biopsy site daily with gentle soap and water. Gently pat the area dry with a clean, soft towel. Avoid harsh scrubbing or rubbing the area, as this can irritate the skin and delay healing.  2. Applying Aquaphor and Bandage:  After cleaning the wound, apply a thin layer of Aquaphor ointment to the biopsy site. Cover the area with a sterile bandage to protect it from dirt, bacteria, and friction. Change the bandage daily or as needed if it becomes soiled or wet.  3. Continued Care for One Week:  Repeat the cleaning, Aquaphor application, and bandaging process daily for one week following the biopsy procedure. Keeping the wound clean and moist during this initial healing period will help prevent infection and promote optimal healing.  4. Massaging Aquaphor into the Area:  ---After one week, discontinue the use of bandages but continue to apply Aquaphor to the biopsy site. ----Gently massage the Aquaphor into the area using circular motions. ---Massaging the skin helps to promote circulation and prevent the formation of scar tissue.   Additional Tips:  Avoid exposing the biopsy site to direct sunlight during the healing process, as this can cause hyperpigmentation or worsen scarring. If you experience any signs of infection, such as increased redness, swelling, warmth, or drainage from the wound, contact your healthcare provider immediately. Follow any additional instructions provided by your healthcare provider for caring for the biopsy site and managing any  discomfort. Conclusion:  Taking proper care of your skin biopsy site is crucial for ensuring optimal healing and minimizing scarring. By following these instructions for cleaning, applying Aquaphor, and massaging the area, you can promote a smooth and successful recovery. If you have any questions or concerns about caring for your biopsy site, don't hesitate to contact your healthcare provider for guidance.    Skin Education : We counseled the patient regarding the following: Sun screen (SPF 30 or greater) should be applied during peak UV exposure (between 10am and 2pm) and reapplied after exercise or swimming.  The ABCDEs of melanoma were reviewed with the patient, and the importance of monthly self-examination of moles was emphasized. Should any moles change in shape or color, or itch, bleed or burn, pt will contact our office for evaluation sooner then their interval appointment.  Plan: Sunscreen Recommendations We recommended a broad spectrum sunscreen with a SPF of 30 or higher.  SPF 30 sunscreens block approximately 97 percent of the sun's harmful rays. Sunscreens should be applied at least 15 minutes prior to expected sun exposure and then every 2 hours after that as long as sun exposure continues. If swimming or exercising sunscreen should be reapplied every 45 minutes to an hour after getting wet or sweating. One ounce, or the equivalent of a shot glass full of sunscreen, is adequate to protect the skin not covered by a bathing suit. We also recommended a lip balm with a sunscreen as well. Sun protective clothing can be used in lieu of sunscreen but must be worn the entire time you are exposed to the sun's rays. Important Information  Due to recent changes in healthcare laws, you may see results of your pathology and/or laboratory studies on MyChart before the doctors have had a chance to review them. We understand that in some cases there may be results that are confusing or concerning to  you. Please understand that not all results are received at the same time and often the doctors may need to interpret multiple results in order to provide you with the best plan of care or course of treatment. Therefore, we ask that you please give [place] 2 business days to thoroughly review all your results before contacting the office for clarification. Should we see a critical lab result, you will be contacted sooner.     If You Need Anything After Your Visit   If you have any questions or concerns for your doctor, please call our main line at [phone]. If no one answers, please leave a voicemail as directed and we will return your call as soon as possible. Messages left after 4 pm will be answered the following business day.    You may also send [place] a message via MyChart. We typically respond to MyChart messages within 1-2 business days.  For prescription refills, please ask your pharmacy to contact our office. Our fax number is [phone].  If you have an urgent issue when the clinic is closed that cannot wait until the next business day, you can page your doctor at the number below.     Please note that while we do our best to be available for urgent issues outside of office hours, we are not available 24/7.    If you have an urgent issue and are unable to reach [place], you may choose to seek medical care at your doctor's office, retail clinic, urgent care center, or emergency room.   If you have a medical emergency, please immediately call 911 or go to the emergency department. In the event of inclement weather, please call our main line at [phone] for an update on the status of any delays or closures.  Dermatology Medication Tips: Please keep the boxes that topical medications come in in order to help keep track of the instructions about where and how to use these. Pharmacies typically print the medication instructions only on the boxes and not directly on the medication tubes.   If  your medication is too expensive, please contact our office at [phone] or send [place] a message through MyChart.    We are unable to tell what your co-pay for medications will be in advance as this is different depending on your insurance coverage. However, we may be able to find a substitute medication at lower cost or fill out paperwork to get insurance to cover a needed medication.    If a prior authorization is required to get your medication covered by your insurance company, please allow [place] 1-2 business days to complete this process.   Drug prices often vary depending on where the prescription is filled and some pharmacies may offer cheaper prices.   The website www.goodrx.com contains coupons for medications through different pharmacies. The prices here do not account for what the cost may be with help from insurance (it may be cheaper with your insurance), but the website can give you the price if you did not use any insurance.  - You can print the associated coupon and take it with your prescription to the pharmacy.  - You may also stop by our office during regular business hours and  pick up a GoodRx coupon card.  - If you need your prescription sent electronically to a different pharmacy, notify our office through [facility] or by phone at [phone]

## 2023-04-26 LAB — SURGICAL PATHOLOGY

## 2023-05-01 ENCOUNTER — Telehealth: Payer: Self-pay

## 2023-05-01 NOTE — Telephone Encounter (Signed)
-----   Message from [facility] sent at 04/30/2023  9:24 AM EST ----- 1. BCC- left nasal ala- Mohs with 2. BCC- right lower cheek- Mohs with me 3. BCC- right chest- Excision with me   Please call patient to discuss diagnosis and schedule for Mohs surgery and excision

## 2023-05-01 NOTE — Telephone Encounter (Signed)
Spoke with pt gave him bx results and treatment plan

## 2023-05-25 ENCOUNTER — Other Ambulatory Visit: Payer: Self-pay | Admitting: Family Medicine

## 2023-05-25 DIAGNOSIS — R6881 Early satiety: Secondary | ICD-10-CM

## 2023-05-25 DIAGNOSIS — R0602 Shortness of breath: Secondary | ICD-10-CM | POA: Diagnosis not present

## 2023-05-25 DIAGNOSIS — R634 Abnormal weight loss: Secondary | ICD-10-CM

## 2023-06-06 DIAGNOSIS — H401131 Primary open-angle glaucoma, bilateral, mild stage: Secondary | ICD-10-CM | POA: Diagnosis not present

## 2023-06-06 DIAGNOSIS — H0100A Unspecified blepharitis right eye, upper and lower eyelids: Secondary | ICD-10-CM | POA: Diagnosis not present

## 2023-06-06 DIAGNOSIS — H0100B Unspecified blepharitis left eye, upper and lower eyelids: Secondary | ICD-10-CM | POA: Diagnosis not present

## 2023-06-15 ENCOUNTER — Encounter: Payer: Self-pay | Admitting: Dermatology

## 2023-06-16 ENCOUNTER — Ambulatory Visit (HOSPITAL_BASED_OUTPATIENT_CLINIC_OR_DEPARTMENT_OTHER)
Admission: RE | Admit: 2023-06-16 | Discharge: 2023-06-16 | Disposition: A | Discharge status: Home / Self Care | Payer: Medicare HMO | Source: Ambulatory Visit | Attending: Family Medicine | Admitting: Family Medicine

## 2023-06-16 DIAGNOSIS — K760 Fatty (change of) liver, not elsewhere classified: Secondary | ICD-10-CM | POA: Diagnosis not present

## 2023-06-16 DIAGNOSIS — K449 Diaphragmatic hernia without obstruction or gangrene: Secondary | ICD-10-CM | POA: Diagnosis not present

## 2023-06-16 DIAGNOSIS — K5730 Diverticulosis of large intestine without perforation or abscess without bleeding: Secondary | ICD-10-CM | POA: Insufficient documentation

## 2023-06-16 DIAGNOSIS — R6881 Early satiety: Secondary | ICD-10-CM | POA: Diagnosis not present

## 2023-06-16 DIAGNOSIS — R634 Abnormal weight loss: Secondary | ICD-10-CM | POA: Diagnosis not present

## 2023-06-16 DIAGNOSIS — I77810 Thoracic aortic ectasia: Secondary | ICD-10-CM | POA: Insufficient documentation

## 2023-06-16 DIAGNOSIS — R911 Solitary pulmonary nodule: Secondary | ICD-10-CM | POA: Insufficient documentation

## 2023-06-16 DIAGNOSIS — K9289 Other specified diseases of the digestive system: Secondary | ICD-10-CM | POA: Diagnosis not present

## 2023-06-16 DIAGNOSIS — J9811 Atelectasis: Secondary | ICD-10-CM | POA: Diagnosis not present

## 2023-06-16 MED ORDER — IOHEXOL 300 MG/ML  SOLN
100.0000 mL | Freq: Once | INTRAMUSCULAR | Status: AC | PRN
  Administered 2023-06-16: 100 mL via INTRAVENOUS

## 2023-06-18 ENCOUNTER — Encounter: Payer: Self-pay | Admitting: Dermatology

## 2023-06-18 ENCOUNTER — Ambulatory Visit: Payer: Medicare HMO | Admitting: Dermatology

## 2023-06-18 VITALS — BP 108/81 | HR 81

## 2023-06-18 DIAGNOSIS — L814 Other melanin hyperpigmentation: Secondary | ICD-10-CM

## 2023-06-18 DIAGNOSIS — C4491 Basal cell carcinoma of skin, unspecified: Secondary | ICD-10-CM

## 2023-06-18 DIAGNOSIS — C44311 Basal cell carcinoma of skin of nose: Secondary | ICD-10-CM

## 2023-06-18 DIAGNOSIS — L579 Skin changes due to chronic exposure to nonionizing radiation, unspecified: Secondary | ICD-10-CM

## 2023-06-18 MED ORDER — MUPIROCIN 2 % EX OINT: 1.0000 | TOPICAL_OINTMENT | Freq: Two times a day (BID) | CUTANEOUS | 1 refills | Status: DC

## 2023-06-18 MED ORDER — OXYCODONE HCL 5 MG PO TABS: 5.0000 mg | ORAL_TABLET | Freq: Four times a day (QID) | ORAL | 0 refills | Status: DC | PRN

## 2023-06-18 MED ORDER — OXYCODONE HCL 5 MG PO CAPS: 5.0000 mg | ORAL_CAPSULE | Freq: Four times a day (QID) | ORAL | 0 refills | Status: DC | PRN

## 2023-06-18 NOTE — Addendum Note (Signed)
Addended by: Gwenith Daily on: 06/18/2023 03:56 PM   Modules accepted: Orders

## 2023-06-18 NOTE — Progress Notes (Signed)
Follow-Up Visit   Subjective  Joseph Walls is a 77 y.o. male who presents for the following: Mohs of the left nasal ala for superficial/nodular BCC, biopsied by Dr. Caralyn Guile.  The following portions of the chart were reviewed this encounter and updated as appropriate: medications, allergies, medical history  Review of Systems:  No other skin or systemic complaints except as noted in HPI or Assessment and Plan.  Objective  Well appearing patient in no apparent distress; mood and affect are within normal limits.  A focused examination was performed of the following areas: nose Relevant physical exam findings are noted in the Assessment and Plan.     Assessment & Plan   BASAL CELL CARCINOMA (BCC), UNSPECIFIED SITE Left nasal ala Mohs surgery  Consent obtained: written  Anticoagulation: Is the patient taking prescription anticoagulant and/or aspirin prescribed/recommended by a physician? No   Was the anticoagulation regimen changed prior to Mohs? No    Anesthesia: Anesthesia method: local infiltration Local anesthetic: lidocaine 1% WITH epi  Procedure Details: Timeout: pre-procedure verification complete Procedure Prep: patient was prepped and draped in usual sterile fashion Prep type: chlorhexidine Pre-Op diagnosis: basal cell carcinoma Surgical site (from skin exam): Left nasal ala Pre-operative length (cm): 1.1 Pre-operative width (cm): 1 Indications for Mohs surgery: anatomic location where tissue conservation is critical  Micrographic Surgery Details: Post-operative length (cm): 1.3 Post-operative width (cm): 1.2 Number of Mohs stages: 2  Stage 1    Tumor features identified on Mohs section: basal carcinoma Related Medications mupirocin ointment (BACTROBAN) 2 % Apply 1 Application topically 2 (two) times daily. To wound under a bandage oxycodone (OXY-IR) 5 MG capsule Take 1 capsule (5 mg total) by mouth every 6 (six) hours as needed for up to 8 doses.  No  follow-ups on file.  Owens Shark, CMA, am acting as scribe for Gwenith Daily, MD.    06/18/2023  HISTORY OF PRESENT ILLNESS  Joseph Walls is seen in consultation at the request of Dr. Caralyn Guile for biopsy-proven superficial and nodular Basal Cell Carcinoma on the left nasal ala. They note that the area has been present for about 1 year increasing in size with time.  There is no history of previous treatment.  Reports no other new or changing lesions and has no other complaints today.  Medications and allergies: see patient chart.  Review of systems: Reviewed 8 systems and notable for the above skin cancer.  All other systems reviewed are unremarkable/negative, unless noted in the HPI. Past medical history, surgical history, family history, social history were also reviewed and are noted in the chart/questionnaire.    PHYSICAL EXAMINATION  General: Well-appearing, in no acute distress, alert and oriented x 4. Vitals reviewed in chart (if available).   Skin: Exam reveals a 1.1 x 1.0 cm erythematous papule and biopsy scar on the left nasal ala. There are rhytids, telangiectasias, and lentigines, consistent with photodamage.   Biopsy report(s) reviewed, confirming the diagnosis.   ASSESSMENT  1) Superficial and Nodular Basal Cell Carcinoma on the left nasal ala 2) photodamage 3) solar lentigines   PLAN   1. Due to location, size, histology, or recurrence and the likelihood of subclinical extension as well as the need to conserve normal surrounding tissue, the patient was deemed acceptable for Mohs micrographic surgery (MMS).  The nature and purpose of the procedure, associated benefits and risks including recurrence and scarring, possible complications such as pain, infection, and bleeding, and alternative methods of treatment if appropriate  were discussed with the patient during consent. The lesion location was verified by the patient, by reviewing previous notes, pathology reports,  and by photographs as well as angulation measurements if available.  Informed consent was reviewed and signed by the patient, and timeout was performed at 9:15 AM. See op note below.  2. For the photodamage and solar lentigines, sun protection discussed/information given on OTC sunscreens, and we recommend continued regular follow-up with primary dermatologist every 6 months or sooner for any growing, bleeding, or changing lesions. 3. Prognosis and future surveillance discussed. 4. Letter with treatment outcome sent to referring provider. 5. Pain acetaminophen/ibuprofen/oxycodone 5 mg  MOHS MICROGRAPHIC SURGERY AND RECONSTRUCTION  Initial size:   1.1 x 1.0 cm Surgical defect/wound size: 1.3 x 1.2 cm Anesthesia:    0.33% lidocaine with 1:200,000 epinephrine EBL:    <5 mL Complications:  None Repair type:   Delayed Graft  Stages: 2  STAGE I: Anesthesia achieved with 0.5% lidocaine with 1:200,000 epinephrine. ChloraPrep applied. 1 section(s) excised using Mohs technique (this includes total peripheral and deep tissue margin excision and evaluation with frozen sections, excised and interpreted by the same physician). The tumor was first debulked and then excised with an approx. 2 mm margin.  Hemostasis was achieved with electrocautery as needed.  The specimen was then oriented, subdivided/relaxed, inked, and processed using Mohs technique.    Frozen section analysis revealed a positive margin for islands of cells with peripheral palisading and a haphazard arrangement of the more central cells in the deep margin.    STAGE II: An additional 2 mm margin was excised.  Hemostasis was achieved with electrocautery as needed.  The specimen was then oriented, subdivided/relaxed, inked, and processed using Mohs technique. Evaluation of slides by the Mohs surgeon revealed clear tumor margins.  Reconstruction  Delayed Skin Graft  The decision to delay the skin graft for the reconstruction of the Mohs  wound until one week post-procedure is based on the goal of allowing the wound to undergo secondary intention healing. This approach facilitates the gradual granulation of tissue and the reduction of wound depth, which can improve the overall quality and viability of the eventual graft site. By allowing the wound to heal partially through secondary intention, we anticipate enhanced wound bed preparation, optimizing conditions for graft take and minimizing the risk of graft failure. This waiting period also allows for a better assessment of the wound's healing potential and ensures that the tissue is sufficiently vascularized, reducing the likelihood of complications such as graft necrosis or infection. Therefore, the delayed grafting is a strategic measure to enhance the long-term outcome of the reconstructive procedure.  The patient will follow up in: 10 days   Documentation: I have reviewed the above documentation for accuracy and completeness, and I agree with the above.  Gwenith Daily, MD

## 2023-06-18 NOTE — Patient Instructions (Signed)

## 2023-06-22 DIAGNOSIS — K449 Diaphragmatic hernia without obstruction or gangrene: Secondary | ICD-10-CM | POA: Diagnosis not present

## 2023-06-22 DIAGNOSIS — F3130 Bipolar disorder, current episode depressed, mild or moderate severity, unspecified: Secondary | ICD-10-CM | POA: Diagnosis not present

## 2023-06-26 ENCOUNTER — Other Ambulatory Visit: Payer: Medicare HMO

## 2023-06-28 ENCOUNTER — Encounter: Payer: Medicare HMO | Admitting: Dermatology

## 2023-07-03 ENCOUNTER — Encounter: Payer: Medicare HMO | Admitting: Dermatology

## 2023-07-06 ENCOUNTER — Ambulatory Visit (HOSPITAL_BASED_OUTPATIENT_CLINIC_OR_DEPARTMENT_OTHER): Payer: Medicare HMO

## 2023-07-17 ENCOUNTER — Ambulatory Visit: Payer: Medicare HMO | Admitting: Dermatology

## 2023-07-17 DIAGNOSIS — K5900 Constipation, unspecified: Secondary | ICD-10-CM | POA: Diagnosis not present

## 2023-07-17 DIAGNOSIS — K449 Diaphragmatic hernia without obstruction or gangrene: Secondary | ICD-10-CM | POA: Diagnosis not present

## 2023-07-17 DIAGNOSIS — R9389 Abnormal findings on diagnostic imaging of other specified body structures: Secondary | ICD-10-CM | POA: Diagnosis not present

## 2023-07-17 DIAGNOSIS — R634 Abnormal weight loss: Secondary | ICD-10-CM | POA: Diagnosis not present

## 2023-07-17 DIAGNOSIS — R6881 Early satiety: Secondary | ICD-10-CM | POA: Diagnosis not present

## 2023-08-02 DIAGNOSIS — R933 Abnormal findings on diagnostic imaging of other parts of digestive tract: Secondary | ICD-10-CM | POA: Diagnosis not present

## 2023-08-02 DIAGNOSIS — K449 Diaphragmatic hernia without obstruction or gangrene: Secondary | ICD-10-CM | POA: Diagnosis not present

## 2023-08-02 DIAGNOSIS — K2930 Chronic superficial gastritis without bleeding: Secondary | ICD-10-CM | POA: Diagnosis not present

## 2023-08-02 DIAGNOSIS — K2950 Unspecified chronic gastritis without bleeding: Secondary | ICD-10-CM | POA: Diagnosis not present

## 2023-09-04 ENCOUNTER — Ambulatory Visit: Payer: Self-pay | Admitting: Surgery

## 2023-09-04 DIAGNOSIS — K3189 Other diseases of stomach and duodenum: Secondary | ICD-10-CM | POA: Insufficient documentation

## 2023-09-04 DIAGNOSIS — K440 Diaphragmatic hernia with obstruction, without gangrene: Secondary | ICD-10-CM | POA: Diagnosis not present

## 2023-09-04 DIAGNOSIS — R634 Abnormal weight loss: Secondary | ICD-10-CM

## 2023-09-21 DIAGNOSIS — K440 Diaphragmatic hernia with obstruction, without gangrene: Secondary | ICD-10-CM | POA: Diagnosis not present

## 2023-09-21 DIAGNOSIS — R634 Abnormal weight loss: Secondary | ICD-10-CM | POA: Diagnosis not present

## 2023-09-21 DIAGNOSIS — K3189 Other diseases of stomach and duodenum: Secondary | ICD-10-CM | POA: Diagnosis not present

## 2023-10-01 NOTE — Patient Instructions (Signed)
 SURGICAL WAITING ROOM VISITATION  Patients having surgery or a procedure may have no more than 2 support people in the waiting area - these visitors may rotate.    Children under the age of 30 must have an adult with them who is not the patient.  Due to an increase in RSV and influenza rates and associated hospitalizations, children ages 60 and under may not visit patients in [facility] hospitals.  Visitors with respiratory illnesses are discouraged from visiting and should remain at home.  If the patient needs to stay at the hospital during part of their recovery, the visitor guidelines for inpatient rooms apply. Pre-op nurse will coordinate an appropriate time for 1 support person to accompany patient in pre-op.  This support person may not rotate.    Please refer to the [facility] website for the visitor guidelines for Inpatients (after your surgery is over and you are in a regular room).       Your procedure is scheduled on: 10/11/23   Report to [facility] Main Entrance    Report to admitting at 8:45 AM   Call this number if you have problems the morning of surgery [phone]   Do not eat food :After Midnight.   After Midnight you may have the following liquids until 8 AM DAY OF SURGERY  Water Non-Citrus Juices (without pulp, NO RED-Apple, White grape, White cranberry) Black Coffee (NO MILK/CREAM OR CREAMERS, sugar ok)  Clear Tea (NO MILK/CREAM OR CREAMERS, sugar ok) regular and decaf                             Plain Jell-O (NO RED)                                           Fruit ices (not with fruit pulp, NO RED)                                     Popsicles (NO RED)                                                               Sports drinks like Gatorade (NO RED)              Drink 2 Ensure/G2 drinks AT 10:00 PM the night before surgery.        The day of surgery:  Drink ONE (1) Pre-Surgery Clear Ensure at 8 AM the morning of surgery. Drink in one  sitting. Do not sip.  This drink was given to you during your hospital  pre-op appointment visit. Nothing else to drink after completing the  Pre-Surgery Clear Ensure .     Oral Hygiene is also important to reduce your risk of infection.                                    Remember - BRUSH YOUR TEETH THE MORNING OF SURGERY WITH YOUR REGULAR TOOTHPASTE   Stop all vitamins  and herbal supplements 7 days before surgery.   Take these medicines the morning of surgery with A SIP OF WATER: amlodipine, atorvastatin, lamotrigine(lamictal), omeprazole(prilosec), Tamsulosin, Xanax if needed.             You may not have any metal on your body including hair pins, jewelry, and body piercing             Do not wear  lotions, powders, cologne, or deodorant              Men may shave face and neck.   Do not bring valuables to the hospital. [facility] IS NOT             RESPONSIBLE   FOR VALUABLES.   Contacts, glasses, dentures or bridgework may not be worn into surgery.   Bring small overnight bag day of surgery.   DO NOT BRING YOUR HOME MEDICATIONS TO THE HOSPITAL. PHARMACY WILL DISPENSE MEDICATIONS LISTED ON YOUR MEDICATION LIST TO YOU DURING YOUR ADMISSION IN THE HOSPITAL!    Patients discharged on the day of surgery will not be allowed to drive home.  Someone NEEDS to stay with you for the first 24 hours after anesthesia.   Special Instructions: Bring a copy of your healthcare power of attorney and living will documents the day of surgery if you haven't scanned them before.              Please read over the following fact sheets you were given: IF YOU HAVE QUESTIONS ABOUT YOUR PRE-OP INSTRUCTIONS PLEASE CALL [phone] Ammon Bales   If you received a COVID test during your pre-op visit  it is requested that you wear a mask when out in public, stay away from anyone that may not be feeling well and notify your surgeon if you develop symptoms. If you test positive for Covid or have been in contact  with anyone that has tested positive in the last 10 days please notify you surgeon.    [facility] - Preparing for Surgery Before surgery, you can play an important role.  Because skin is not sterile, your skin needs to be as free of germs as possible.  You can reduce the number of germs on your skin by washing with CHG (chlorahexidine gluconate) soap before surgery.  CHG is an antiseptic cleaner which kills germs and bonds with the skin to continue killing germs even after washing. Please DO NOT use if you have an allergy to CHG or antibacterial soaps.  If your skin becomes reddened/irritated stop using the CHG and inform your nurse when you arrive at Short Stay. Do not shave (including legs and underarms) for at least 48 hours prior to the first CHG shower.  You may shave your face/neck.  Please follow these instructions carefully:  1.  Shower with CHG Soap the night before surgery and the  morning of surgery.  2.  If you choose to wash your hair, wash your hair first as usual with your normal  shampoo.  3.  After you shampoo, rinse your hair and body thoroughly to remove the shampoo.                             4.  Use CHG as you would any other liquid soap.  You can apply chg directly to the skin and wash.  Gently with a scrungie or clean washcloth.  5.  Apply the CHG Soap to your  body ONLY FROM THE NECK DOWN.   Do   not use on face/ open                           Wound or open sores. Avoid contact with eyes, ears mouth and   genitals (private parts).                       Wash face,  Genitals (private parts) with your normal soap.             6.  Wash thoroughly, paying special attention to the area where your    surgery  will be performed.  7.  Thoroughly rinse your body with warm water from the neck down.  8.  DO NOT shower/wash with your normal soap after using and rinsing off the CHG Soap.                9.  Pat yourself dry with a clean towel.            10.  Wear clean pajamas.             11.  Place clean sheets on your bed the night of your first shower and do not  sleep with pets. Day of Surgery : Do not apply any lotions/deodorants the morning of surgery.  Please wear clean clothes to the hospital/surgery center.  FAILURE TO FOLLOW THESE INSTRUCTIONS MAY RESULT IN THE CANCELLATION OF YOUR SURGERY  PATIENT SIGNATURE_________________________________  NURSE SIGNATURE__________________________________  ________________________________________________________________________

## 2023-10-01 NOTE — Progress Notes (Signed)
 COVID Vaccine received:  []  No [x]  Yes Date of any COVID positive Test in last 90 days: no PCP - Dibas Koirala MD Cardiologist -   Chest x-ray - CT chest 06/16/23 Epic EKG -  10/02/23 Epic Stress Test -  ECHO -  Cardiac Cath -   Bowel Prep - [x]  No  []   Yes ______  Pacemaker / ICD device [x]  No []  Yes   Spinal Cord Stimulator:[x]  No []  Yes       History of Sleep Apnea? [x]  No []  Yes   CPAP used?- [x]  No []  Yes    Does the patient monitor blood sugar?          [x]  No []  Yes  []  N/A  Patient has: [x]  NO Hx DM   []  Pre-DM                 []  DM1  []   DM2 Does patient have a [employer] or [employer]? []  No []  Yes   Fasting Blood Sugar Ranges-  Checks Blood Sugar _____ times a day  GLP1 agonist / usual dose - no GLP1 instructions:  SGLT-2 inhibitors / usual dose - no SGLT-2 instructions:   Blood Thinner / Instructions:no Aspirin Instructions:no  Comments:   Activity level: Patient is able to climb a flight of stairs without difficulty; [x]  No CP  [x]  No SOB,Patient can / perform ADLs without assistance.   Anesthesia review:   Patient denies shortness of breath, fever, cough and chest pain at PAT appointment.  Patient verbalized understanding and agreement to the Pre-Surgical Instructions that were given to them at this PAT appointment. Patient was also educated of the need to review these PAT instructions again prior to his/her surgery.I reviewed the appropriate phone numbers to call if they have any and questions or concerns.

## 2023-10-02 ENCOUNTER — Encounter (HOSPITAL_COMMUNITY): Payer: Self-pay

## 2023-10-02 ENCOUNTER — Other Ambulatory Visit: Payer: Self-pay

## 2023-10-02 ENCOUNTER — Encounter (HOSPITAL_COMMUNITY)
Admission: RE | Admit: 2023-10-02 | Discharge: 2023-10-02 | Disposition: A | Discharge status: Home / Self Care | Source: Ambulatory Visit | Attending: Surgery | Admitting: Surgery

## 2023-10-02 VITALS — BP 113/99 | HR 80 | Temp 98.3°F | Resp 16 | Ht 72.0 in | Wt 176.0 lb

## 2023-10-02 DIAGNOSIS — R634 Abnormal weight loss: Secondary | ICD-10-CM | POA: Insufficient documentation

## 2023-10-02 DIAGNOSIS — Z01818 Encounter for other preprocedural examination: Secondary | ICD-10-CM | POA: Diagnosis not present

## 2023-10-02 HISTORY — DX: Bipolar disorder, unspecified: F31.9

## 2023-10-02 HISTORY — DX: Personal history of other diseases of the digestive system: Z87.19

## 2023-10-02 LAB — COMPREHENSIVE METABOLIC PANEL WITH GFR
ALT: 14 U/L (ref 0–44)
AST: 18 U/L (ref 15–41)
Albumin: 4 g/dL (ref 3.5–5.0)
Alkaline Phosphatase: 57 U/L (ref 38–126)
Anion gap: 8 (ref 5–15)
BUN: 16 mg/dL (ref 8–23)
CO2: 28 mmol/L (ref 22–32)
Calcium: 9 mg/dL (ref 8.9–10.3)
Chloride: 105 mmol/L (ref 98–111)
Creatinine, Ser: 0.95 mg/dL (ref 0.61–1.24)
GFR, Estimated: >60 mL/min (ref >60)
Glucose, Bld: 108 mg/dL — ABNORMAL HIGH (ref 70–99)
Potassium: 4.3 mmol/L (ref 3.5–5.1)
Sodium: 141 mmol/L (ref 135–145)
Total Bilirubin: 0.9 mg/dL (ref 0.0–1.2)
Total Protein: 7 g/dL (ref 6.5–8.1)

## 2023-10-02 LAB — CBC WITH DIFFERENTIAL/PLATELET
Abs Immature Granulocytes: 0.01 10*3/uL (ref 0.00–0.07)
Basophils Absolute: 0.1 10*3/uL (ref 0.0–0.1)
Basophils Relative: 1 %
Eosinophils Absolute: 0.2 10*3/uL (ref 0.0–0.5)
Eosinophils Relative: 2 %
HCT: 47.6 % (ref 39.0–52.0)
Hemoglobin: 15.3 g/dL (ref 13.0–17.0)
Immature Granulocytes: 0 %
Lymphocytes Relative: 33 %
Lymphs Abs: 2.3 10*3/uL (ref 0.7–4.0)
MCH: 31.3 pg (ref 26.0–34.0)
MCHC: 32.1 g/dL (ref 30.0–36.0)
MCV: 97.3 fL (ref 80.0–100.0)
Monocytes Absolute: 0.7 10*3/uL (ref 0.1–1.0)
Monocytes Relative: 10 %
Neutro Abs: 3.8 10*3/uL (ref 1.7–7.7)
Neutrophils Relative %: 54 %
Platelets: 246 10*3/uL (ref 150–400)
RBC: 4.89 MIL/uL (ref 4.22–5.81)
RDW: 12.8 % (ref 11.5–15.5)
WBC: 6.9 10*3/uL (ref 4.0–10.5)
nRBC: 0 % (ref 0.0–0.2)

## 2023-10-10 NOTE — Anesthesia Preprocedure Evaluation (Signed)
 Anesthesia Evaluation  Patient identified by MRN, date of birth, ID band Patient awake    Reviewed: Allergy & Precautions, NPO status , Patient's Chart, lab work & pertinent test results  History of Anesthesia Complications Negative for: history of anesthetic complications  Airway Mallampati: III  TM Distance: >3 FB Neck ROM: Full    Dental  (+) Dental Advisory Given, Caps All teeth are capped.:   Pulmonary neg pulmonary ROS   Pulmonary exam normal breath sounds clear to auscultation       Cardiovascular hypertension (amlodipine , losartan), Pt. on medications (-) angina (-) Past MI, (-) Cardiac Stents and (-) CABG (-) dysrhythmias  Rhythm:Regular Rate:Normal  TTE 02/19/2018: Study Conclusions   - Left ventricle: The cavity size was normal. Wall thickness was    normal. Systolic function was normal. The estimated ejection    fraction was in the range of 55% to 60%. Wall motion was normal;    there were no regional wall motion abnormalities. Doppler    parameters are consistent with abnormal left ventricular    relaxation (grade 1 diastolic dysfunction).  - Aortic valve: There was trivial regurgitation.  - Mitral valve: There was mild regurgitation.  - Right atrium: The atrium was mildly dilated.     Neuro/Psych  PSYCHIATRIC DISORDERS   Bipolar Disorder   negative neurological ROS     GI/Hepatic Neg liver ROS, hiatal hernia,GERD  Medicated,,  Endo/Other  negative endocrine ROS    Renal/GU negative Renal ROS     Musculoskeletal   Abdominal   Peds  Hematology negative hematology ROS (+) Lab Results      Component                Value               Date                      WBC                      6.9                 10/02/2023                HGB                      15.3                10/02/2023                HCT                      47.6                10/02/2023                MCV                      97.3                 10/02/2023                PLT                      246                 10/02/2023  Anesthesia Other Findings   Reproductive/Obstetrics                              Anesthesia Physical Anesthesia Plan  ASA: 3  Anesthesia Plan: General   Post-op Pain Management: Tylenol  PO (pre-op)*   Induction: Intravenous  PONV Risk Score and Plan: 2 and Ondansetron , Dexamethasone  and Treatment may vary due to age or medical condition  Airway Management Planned: Oral ETT  Additional Equipment:   Intra-op Plan:   Post-operative Plan: Extubation in OR  Informed Consent: I have reviewed the patients History and Physical, chart, labs and discussed the procedure including the risks, benefits and alternatives for the proposed anesthesia with the patient or authorized representative who has indicated his/her understanding and acceptance.     Dental advisory given  Plan Discussed with: CRNA and Anesthesiologist  Anesthesia Plan Comments: (Risks of general anesthesia discussed including, but not limited to, sore throat, hoarse voice, chipped/damaged teeth, injury to vocal cords, nausea and vomiting, allergic reactions, lung infection, heart attack, stroke, and death. All questions answered. )         Anesthesia Quick Evaluation

## 2023-10-11 ENCOUNTER — Encounter (HOSPITAL_COMMUNITY)
Admission: RE | Disposition: A | Discharge status: Home / Self Care | Payer: Self-pay | Source: Home / Self Care | Attending: Surgery

## 2023-10-11 ENCOUNTER — Ambulatory Visit (HOSPITAL_COMMUNITY): Payer: Self-pay | Admitting: Anesthesiology

## 2023-10-11 ENCOUNTER — Observation Stay (HOSPITAL_COMMUNITY)
Admission: RE | Admit: 2023-10-11 | Discharge: 2023-10-12 | Disposition: A | Discharge status: Home / Self Care | Attending: Surgery | Admitting: Surgery

## 2023-10-11 ENCOUNTER — Other Ambulatory Visit: Payer: Self-pay

## 2023-10-11 ENCOUNTER — Ambulatory Visit (HOSPITAL_BASED_OUTPATIENT_CLINIC_OR_DEPARTMENT_OTHER): Payer: Self-pay | Admitting: Anesthesiology

## 2023-10-11 ENCOUNTER — Encounter (HOSPITAL_COMMUNITY): Payer: Self-pay | Admitting: Surgery

## 2023-10-11 DIAGNOSIS — Z87891 Personal history of nicotine dependence: Secondary | ICD-10-CM | POA: Insufficient documentation

## 2023-10-11 DIAGNOSIS — K440 Diaphragmatic hernia with obstruction, without gangrene: Secondary | ICD-10-CM

## 2023-10-11 DIAGNOSIS — I10 Essential (primary) hypertension: Secondary | ICD-10-CM | POA: Insufficient documentation

## 2023-10-11 DIAGNOSIS — Z79899 Other long term (current) drug therapy: Secondary | ICD-10-CM | POA: Diagnosis not present

## 2023-10-11 DIAGNOSIS — F319 Bipolar disorder, unspecified: Secondary | ICD-10-CM | POA: Diagnosis not present

## 2023-10-11 DIAGNOSIS — F32A Depression, unspecified: Secondary | ICD-10-CM | POA: Insufficient documentation

## 2023-10-11 DIAGNOSIS — G4700 Insomnia, unspecified: Secondary | ICD-10-CM | POA: Diagnosis not present

## 2023-10-11 DIAGNOSIS — G4733 Obstructive sleep apnea (adult) (pediatric): Secondary | ICD-10-CM | POA: Diagnosis not present

## 2023-10-11 DIAGNOSIS — Z01818 Encounter for other preprocedural examination: Principal | ICD-10-CM

## 2023-10-11 DIAGNOSIS — K449 Diaphragmatic hernia without obstruction or gangrene: Secondary | ICD-10-CM | POA: Diagnosis not present

## 2023-10-11 HISTORY — PX: XI ROBOTIC ASSISTED PARAESOPHAGEAL HERNIA REPAIR: SHX6871

## 2023-10-11 LAB — TYPE AND SCREEN
ABO/RH(D): A POS
Antibody Screen: NEGATIVE

## 2023-10-11 LAB — ABO/RH: ABO/RH(D): A POS

## 2023-10-11 SURGERY — REPAIR, HERNIA, PARAESOPHAGEAL, ROBOT-ASSISTED
Anesthesia: General

## 2023-10-11 MED ORDER — ENSURE PRE-SURGERY PO LIQD: 296.0000 mL | Freq: Once | ORAL | Status: DC

## 2023-10-11 MED ORDER — MAGIC MOUTHWASH: 15.0000 mL | Freq: Four times a day (QID) | ORAL | Status: DC | PRN

## 2023-10-11 MED ORDER — ENOXAPARIN SODIUM 40 MG/0.4ML IJ SOSY
40.0000 mg | PREFILLED_SYRINGE | INTRAMUSCULAR | Status: DC
  Administered 2023-10-12: 40 mg via SUBCUTANEOUS
  Filled 2023-10-11: qty 0.4

## 2023-10-11 MED ORDER — FENTANYL CITRATE (PF) 100 MCG/2ML IJ SOLN
INTRAMUSCULAR | Status: DC | PRN
Start: 2023-10-11 — End: 2023-10-11
  Administered 2023-10-11: 50 ug via INTRAVENOUS

## 2023-10-11 MED ORDER — SODIUM CHLORIDE 0.9 % IV SOLN
2.0000 g | INTRAVENOUS | Status: AC
  Administered 2023-10-11: 2 g via INTRAVENOUS
  Filled 2023-10-11: qty 20

## 2023-10-11 MED ORDER — TAMSULOSIN HCL 0.4 MG PO CAPS
0.4000 mg | ORAL_CAPSULE | Freq: Every day | ORAL | Status: DC
  Administered 2023-10-12: 0.4 mg via ORAL
  Filled 2023-10-11: qty 1

## 2023-10-11 MED ORDER — CHLORHEXIDINE GLUCONATE CLOTH 2 % EX PADS
6.0000 | MEDICATED_PAD | Freq: Once | CUTANEOUS | Status: DC
Start: 2023-10-11 — End: 2023-10-11

## 2023-10-11 MED ORDER — SIMETHICONE 80 MG PO CHEW
80.0000 mg | CHEWABLE_TABLET | Freq: Four times a day (QID) | ORAL | Status: DC
Start: 2023-10-11 — End: 2023-10-12
  Administered 2023-10-11 – 2023-10-12 (×3): 80 mg via ORAL
  Filled 2023-10-11 (×4): qty 1

## 2023-10-11 MED ORDER — NAPHAZOLINE-GLYCERIN 0.012-0.25 % OP SOLN
1.0000 [drp] | Freq: Four times a day (QID) | OPHTHALMIC | Status: DC | PRN
Start: 2023-10-11 — End: 2023-10-12

## 2023-10-11 MED ORDER — ROCURONIUM BROMIDE 10 MG/ML (PF) SYRINGE
PREFILLED_SYRINGE | INTRAVENOUS | Status: AC
  Filled 2023-10-11: qty 10

## 2023-10-11 MED ORDER — LACTATED RINGERS IV SOLN: Freq: Three times a day (TID) | INTRAVENOUS | Status: DC | PRN

## 2023-10-11 MED ORDER — FENTANYL CITRATE PF 50 MCG/ML IJ SOSY: 25.0000 ug | PREFILLED_SYRINGE | INTRAMUSCULAR | Status: DC | PRN

## 2023-10-11 MED ORDER — LIDOCAINE HCL 2 % IJ SOLN
INTRAMUSCULAR | Status: AC
  Filled 2023-10-11: qty 20

## 2023-10-11 MED ORDER — LIDOCAINE HCL (CARDIAC) PF 100 MG/5ML IV SOSY
PREFILLED_SYRINGE | INTRAVENOUS | Status: DC | PRN
  Administered 2023-10-11: 80 mg via INTRAVENOUS

## 2023-10-11 MED ORDER — EPINEPHRINE PF 1 MG/ML IJ SOLN
INTRAMUSCULAR | Status: AC
  Filled 2023-10-11: qty 1

## 2023-10-11 MED ORDER — EPINEPHRINE PF 1 MG/ML IJ SOLN
INTRAMUSCULAR | Status: DC | PRN
  Administered 2023-10-11: .01 mg via INTRAVENOUS

## 2023-10-11 MED ORDER — DEXAMETHASONE SODIUM PHOSPHATE 10 MG/ML IJ SOLN
4.0000 mg | INTRAMUSCULAR | Status: AC
  Administered 2023-10-11: 4 mg via INTRAVENOUS

## 2023-10-11 MED ORDER — 0.9 % SODIUM CHLORIDE (POUR BTL) OPTIME
TOPICAL | Status: DC | PRN
  Administered 2023-10-11: 1000 mL

## 2023-10-11 MED ORDER — BUPIVACAINE LIPOSOME 1.3 % IJ SUSP: 20.0000 mL | Freq: Once | INTRAMUSCULAR | Status: DC

## 2023-10-11 MED ORDER — EPHEDRINE 5 MG/ML INJ
INTRAVENOUS | Status: AC
  Filled 2023-10-11: qty 5

## 2023-10-11 MED ORDER — ALBUMIN HUMAN 5 % IV SOLN
INTRAVENOUS | Status: AC
  Filled 2023-10-11: qty 250

## 2023-10-11 MED ORDER — POLYETHYLENE GLYCOL 3350 17 G PO PACK
17.0000 g | PACK | Freq: Every day | ORAL | Status: DC
  Administered 2023-10-11 – 2023-10-12 (×2): 17 g via ORAL
  Filled 2023-10-11 (×2): qty 1

## 2023-10-11 MED ORDER — SODIUM CHLORIDE (PF) 0.9 % IJ SOLN
INTRAMUSCULAR | Status: AC
  Filled 2023-10-11: qty 10

## 2023-10-11 MED ORDER — OXYCODONE HCL 5 MG/5ML PO SOLN: 5.0000 mg | Freq: Once | ORAL | Status: DC | PRN

## 2023-10-11 MED ORDER — ARTIFICIAL TEARS OPHTHALMIC OINT
TOPICAL_OINTMENT | OPHTHALMIC | Status: AC
  Filled 2023-10-11: qty 3.5

## 2023-10-11 MED ORDER — METHOCARBAMOL 1000 MG/10ML IJ SOLN
1000.0000 mg | Freq: Four times a day (QID) | INTRAMUSCULAR | Status: DC | PRN
Start: 2023-10-11 — End: 2023-10-12

## 2023-10-11 MED ORDER — ENALAPRILAT 1.25 MG/ML IV SOLN
0.6250 mg | Freq: Four times a day (QID) | INTRAVENOUS | Status: DC | PRN
Start: 2023-10-11 — End: 2023-10-12

## 2023-10-11 MED ORDER — ROCURONIUM BROMIDE 100 MG/10ML IV SOLN
INTRAVENOUS | Status: DC | PRN
  Administered 2023-10-11 (×2): 10 mg via INTRAVENOUS
  Administered 2023-10-11: 60 mg via INTRAVENOUS
  Administered 2023-10-11: 10 mg via INTRAVENOUS

## 2023-10-11 MED ORDER — KCL IN DEXTROSE-NACL 20-5-0.45 MEQ/L-%-% IV SOLN
INTRAVENOUS | Status: DC
  Filled 2023-10-11 (×2): qty 1000

## 2023-10-11 MED ORDER — LAMOTRIGINE 100 MG PO TABS
200.0000 mg | ORAL_TABLET | Freq: Every day | ORAL | Status: DC
  Administered 2023-10-12: 200 mg via ORAL
  Filled 2023-10-11: qty 2

## 2023-10-11 MED ORDER — LIDOCAINE HCL (PF) 2 % IJ SOLN
INTRAMUSCULAR | Status: AC
  Filled 2023-10-11: qty 5

## 2023-10-11 MED ORDER — ONDANSETRON HCL 4 MG/2ML IJ SOLN
INTRAMUSCULAR | Status: DC | PRN
  Administered 2023-10-11: 4 mg via INTRAVENOUS

## 2023-10-11 MED ORDER — MAGNESIUM HYDROXIDE 400 MG/5ML PO SUSP: 30.0000 mL | Freq: Every day | ORAL | Status: DC | PRN

## 2023-10-11 MED ORDER — ACETAMINOPHEN 500 MG PO TABS
1000.0000 mg | ORAL_TABLET | ORAL | Status: AC
Start: 2023-10-11 — End: 2023-10-11
  Administered 2023-10-11: 1000 mg via ORAL
  Filled 2023-10-11: qty 2

## 2023-10-11 MED ORDER — BUPIVACAINE-EPINEPHRINE (PF) 0.25% -1:200000 IJ SOLN
INTRAMUSCULAR | Status: AC
  Filled 2023-10-11: qty 30

## 2023-10-11 MED ORDER — METOCLOPRAMIDE HCL 5 MG/ML IJ SOLN: 5.0000 mg | Freq: Three times a day (TID) | INTRAMUSCULAR | Status: DC | PRN

## 2023-10-11 MED ORDER — METOCLOPRAMIDE HCL 5 MG PO TABS: 5.0000 mg | ORAL_TABLET | Freq: Three times a day (TID) | ORAL | Status: DC | PRN

## 2023-10-11 MED ORDER — PROCHLORPERAZINE MALEATE 10 MG PO TABS: 10.0000 mg | ORAL_TABLET | Freq: Four times a day (QID) | ORAL | Status: DC | PRN

## 2023-10-11 MED ORDER — BUPIVACAINE LIPOSOME 1.3 % IJ SUSP
INTRAMUSCULAR | Status: AC
Start: 2023-10-11 — End: ?
  Filled 2023-10-11: qty 20

## 2023-10-11 MED ORDER — PROPOFOL 500 MG/50ML IV EMUL
INTRAVENOUS | Status: DC | PRN
  Administered 2023-10-11: 75 ug/kg/min via INTRAVENOUS

## 2023-10-11 MED ORDER — AMISULPRIDE (ANTIEMETIC) 5 MG/2ML IV SOLN: 10.0000 mg | Freq: Once | INTRAVENOUS | Status: DC | PRN

## 2023-10-11 MED ORDER — OXYCODONE HCL 5 MG PO TABS
5.0000 mg | ORAL_TABLET | ORAL | Status: DC | PRN
  Administered 2023-10-11 – 2023-10-12 (×2): 10 mg via ORAL
  Filled 2023-10-11 (×2): qty 2

## 2023-10-11 MED ORDER — CHLORHEXIDINE GLUCONATE CLOTH 2 % EX PADS: 6.0000 | MEDICATED_PAD | Freq: Once | CUTANEOUS | Status: DC

## 2023-10-11 MED ORDER — PROPOFOL 1000 MG/100ML IV EMUL
INTRAVENOUS | Status: AC
  Filled 2023-10-11: qty 100

## 2023-10-11 MED ORDER — SUGAMMADEX SODIUM 200 MG/2ML IV SOLN
INTRAVENOUS | Status: DC | PRN
Start: 2023-10-11 — End: 2023-10-11
  Administered 2023-10-11: 200 mg via INTRAVENOUS

## 2023-10-11 MED ORDER — VASOPRESSIN 20 UNIT/ML IV SOLN
INTRAVENOUS | Status: DC | PRN
  Administered 2023-10-11 (×4): 1 [IU] via INTRAVENOUS
  Administered 2023-10-11: 2 [IU] via INTRAVENOUS

## 2023-10-11 MED ORDER — SIMETHICONE 80 MG PO CHEW: 40.0000 mg | CHEWABLE_TABLET | Freq: Four times a day (QID) | ORAL | Status: DC | PRN

## 2023-10-11 MED ORDER — SODIUM CHLORIDE 0.9 % IV SOLN: INTRAVENOUS | Status: AC

## 2023-10-11 MED ORDER — VITAMIN B-12 1000 MCG PO TABS
500.0000 ug | ORAL_TABLET | Freq: Every day | ORAL | Status: DC
  Administered 2023-10-11 – 2023-10-12 (×2): 500 ug via ORAL
  Filled 2023-10-11 (×2): qty 1

## 2023-10-11 MED ORDER — DEXAMETHASONE SODIUM PHOSPHATE 10 MG/ML IJ SOLN
8.0000 mg | Freq: Two times a day (BID) | INTRAMUSCULAR | Status: DC
  Administered 2023-10-11 – 2023-10-12 (×2): 8 mg via INTRAVENOUS
  Filled 2023-10-11 (×2): qty 1

## 2023-10-11 MED ORDER — BUPIVACAINE-EPINEPHRINE 0.25% -1:200000 IJ SOLN
INTRAMUSCULAR | Status: DC | PRN
  Administered 2023-10-11: 30 mL

## 2023-10-11 MED ORDER — DIPHENHYDRAMINE HCL 12.5 MG/5ML PO ELIX: 12.5000 mg | ORAL_SOLUTION | Freq: Four times a day (QID) | ORAL | Status: DC | PRN

## 2023-10-11 MED ORDER — DEXAMETHASONE SODIUM PHOSPHATE 10 MG/ML IJ SOLN
INTRAMUSCULAR | Status: AC
  Filled 2023-10-11: qty 1

## 2023-10-11 MED ORDER — VASOPRESSIN 20 UNIT/ML IV SOLN
INTRAVENOUS | Status: AC
  Filled 2023-10-11: qty 1

## 2023-10-11 MED ORDER — SODIUM CHLORIDE 0.9 % IV SOLN: 250.0000 mL | INTRAVENOUS | Status: DC | PRN

## 2023-10-11 MED ORDER — CHLORHEXIDINE GLUCONATE 0.12 % MT SOLN
15.0000 mL | Freq: Once | OROMUCOSAL | Status: AC
  Administered 2023-10-11: 15 mL via OROMUCOSAL

## 2023-10-11 MED ORDER — PROPOFOL 10 MG/ML IV BOLUS
INTRAVENOUS | Status: AC
  Filled 2023-10-11: qty 20

## 2023-10-11 MED ORDER — ALPRAZOLAM 0.25 MG PO TABS: 0.2500 mg | ORAL_TABLET | Freq: Every day | ORAL | Status: DC | PRN

## 2023-10-11 MED ORDER — HYDROMORPHONE HCL 1 MG/ML IJ SOLN
0.5000 mg | INTRAMUSCULAR | Status: DC | PRN
  Administered 2023-10-11: 1 mg via INTRAVENOUS
  Filled 2023-10-11: qty 1

## 2023-10-11 MED ORDER — ONDANSETRON HCL 4 MG PO TABS: 4.0000 mg | ORAL_TABLET | Freq: Three times a day (TID) | ORAL | 20 refills | Status: AC | PRN

## 2023-10-11 MED ORDER — LACTATED RINGERS IV SOLN: INTRAVENOUS | Status: DC

## 2023-10-11 MED ORDER — SALINE SPRAY 0.65 % NA SOLN: 1.0000 | Freq: Four times a day (QID) | NASAL | Status: DC | PRN

## 2023-10-11 MED ORDER — EPHEDRINE SULFATE-NACL 50-0.9 MG/10ML-% IV SOSY
PREFILLED_SYRINGE | INTRAVENOUS | Status: DC | PRN
  Administered 2023-10-11: 10 mg via INTRAVENOUS
  Administered 2023-10-11: 5 mg via INTRAVENOUS
  Administered 2023-10-11: 10 mg via INTRAVENOUS

## 2023-10-11 MED ORDER — LATANOPROST 0.005 % OP SOLN
1.0000 [drp] | Freq: Every day | OPHTHALMIC | Status: DC
  Administered 2023-10-11: 1 [drp] via OPHTHALMIC
  Filled 2023-10-11: qty 2.5

## 2023-10-11 MED ORDER — METOPROLOL TARTRATE 5 MG/5ML IV SOLN: 5.0000 mg | Freq: Four times a day (QID) | INTRAVENOUS | Status: DC | PRN

## 2023-10-11 MED ORDER — METHOCARBAMOL 500 MG PO TABS
500.0000 mg | ORAL_TABLET | Freq: Four times a day (QID) | ORAL | Status: DC | PRN
  Administered 2023-10-11 – 2023-10-12 (×2): 500 mg via ORAL
  Filled 2023-10-11 (×2): qty 1

## 2023-10-11 MED ORDER — ATORVASTATIN CALCIUM 20 MG PO TABS
20.0000 mg | ORAL_TABLET | Freq: Every day | ORAL | Status: DC
  Administered 2023-10-12: 20 mg via ORAL
  Filled 2023-10-11: qty 1

## 2023-10-11 MED ORDER — BISACODYL 10 MG RE SUPP
10.0000 mg | Freq: Every day | RECTAL | Status: DC
  Administered 2023-10-12: 10 mg via RECTAL
  Filled 2023-10-11: qty 1

## 2023-10-11 MED ORDER — ONDANSETRON 4 MG PO TBDP: 4.0000 mg | ORAL_TABLET | Freq: Four times a day (QID) | ORAL | Status: DC | PRN

## 2023-10-11 MED ORDER — LACTATED RINGERS IR SOLN
Status: DC | PRN
  Administered 2023-10-11: 1000 mL

## 2023-10-11 MED ORDER — PROCHLORPERAZINE EDISYLATE 10 MG/2ML IJ SOLN: 5.0000 mg | Freq: Four times a day (QID) | INTRAMUSCULAR | Status: DC | PRN

## 2023-10-11 MED ORDER — AMLODIPINE BESYLATE 10 MG PO TABS
10.0000 mg | ORAL_TABLET | Freq: Every day | ORAL | Status: DC
Start: 2023-10-12 — End: 2023-10-12
  Administered 2023-10-12: 10 mg via ORAL
  Filled 2023-10-11: qty 1

## 2023-10-11 MED ORDER — TRAMADOL HCL 50 MG PO TABS: 50.0000 mg | ORAL_TABLET | Freq: Four times a day (QID) | ORAL | Status: DC | PRN

## 2023-10-11 MED ORDER — GABAPENTIN 100 MG PO CAPS
200.0000 mg | ORAL_CAPSULE | ORAL | Status: AC
  Administered 2023-10-11: 200 mg via ORAL
  Filled 2023-10-11: qty 2

## 2023-10-11 MED ORDER — ONDANSETRON HCL 4 MG/2ML IJ SOLN: 4.0000 mg | Freq: Four times a day (QID) | INTRAMUSCULAR | Status: DC | PRN

## 2023-10-11 MED ORDER — OXYCODONE HCL 5 MG PO TABS: 5.0000 mg | ORAL_TABLET | Freq: Once | ORAL | Status: DC | PRN

## 2023-10-11 MED ORDER — FENTANYL CITRATE (PF) 250 MCG/5ML IJ SOLN
INTRAMUSCULAR | Status: AC
  Filled 2023-10-11: qty 5

## 2023-10-11 MED ORDER — TRAMADOL HCL 50 MG PO TABS: 50.0000 mg | ORAL_TABLET | Freq: Four times a day (QID) | ORAL | 0 refills | Status: AC | PRN

## 2023-10-11 MED ORDER — SODIUM CHLORIDE 0.9% FLUSH
3.0000 mL | Freq: Two times a day (BID) | INTRAVENOUS | Status: DC
  Administered 2023-10-12: 3 mL via INTRAVENOUS

## 2023-10-11 MED ORDER — ONDANSETRON HCL 4 MG/2ML IJ SOLN
INTRAMUSCULAR | Status: AC
  Filled 2023-10-11: qty 2

## 2023-10-11 MED ORDER — PROPOFOL 10 MG/ML IV BOLUS
INTRAVENOUS | Status: DC | PRN
  Administered 2023-10-11: 120 mg via INTRAVENOUS

## 2023-10-11 MED ORDER — LIDOCAINE HCL (PF) 2 % IJ SOLN: INTRAMUSCULAR | Status: DC | PRN

## 2023-10-11 MED ORDER — ACETAMINOPHEN 500 MG PO TABS
1000.0000 mg | ORAL_TABLET | Freq: Four times a day (QID) | ORAL | Status: DC
  Administered 2023-10-11 – 2023-10-12 (×4): 1000 mg via ORAL
  Filled 2023-10-11 (×4): qty 2

## 2023-10-11 MED ORDER — SODIUM CHLORIDE 0.9% FLUSH: 3.0000 mL | INTRAVENOUS | Status: DC | PRN

## 2023-10-11 MED ORDER — DIPHENHYDRAMINE HCL 50 MG/ML IJ SOLN: 12.5000 mg | Freq: Four times a day (QID) | INTRAMUSCULAR | Status: DC | PRN

## 2023-10-11 MED ORDER — PANTOPRAZOLE SODIUM 40 MG PO TBEC
80.0000 mg | DELAYED_RELEASE_TABLET | Freq: Every day | ORAL | Status: DC
  Administered 2023-10-12: 80 mg via ORAL
  Filled 2023-10-11: qty 2

## 2023-10-11 MED ORDER — ORAL CARE MOUTH RINSE: 15.0000 mL | Freq: Once | OROMUCOSAL | Status: AC

## 2023-10-11 MED ORDER — LIDOCAINE HCL (PF) 2 % IJ SOLN
INTRAMUSCULAR | Status: DC | PRN
  Administered 2023-10-11: 1.5 mg/kg/h

## 2023-10-11 MED ORDER — ALBUMIN HUMAN 5 % IV SOLN
INTRAVENOUS | Status: DC | PRN
Start: 2023-10-11 — End: 2023-10-11

## 2023-10-11 MED ORDER — PHENYLEPHRINE HCL-NACL 20-0.9 MG/250ML-% IV SOLN
INTRAVENOUS | Status: DC | PRN
  Administered 2023-10-11 (×2): 80 ug via INTRAVENOUS
  Administered 2023-10-11 (×2): 160 ug via INTRAVENOUS
  Administered 2023-10-11: 40 ug/min via INTRAVENOUS

## 2023-10-11 MED ORDER — BUPIVACAINE LIPOSOME 1.3 % IJ SUSP
INTRAMUSCULAR | Status: DC | PRN
  Administered 2023-10-11: 20 mL

## 2023-10-11 MED ORDER — BISACODYL 10 MG RE SUPP: 10.0000 mg | Freq: Every day | RECTAL | Status: DC | PRN

## 2023-10-11 MED ORDER — DORZOLAMIDE HCL-TIMOLOL MAL 2-0.5 % OP SOLN
1.0000 [drp] | Freq: Two times a day (BID) | OPHTHALMIC | Status: DC
Start: 2023-10-11 — End: 2023-10-12
  Administered 2023-10-11 – 2023-10-12 (×2): 1 [drp] via OPHTHALMIC
  Filled 2023-10-11: qty 10

## 2023-10-11 MED ORDER — ENSURE PRE-SURGERY PO LIQD
592.0000 mL | Freq: Once | ORAL | Status: DC
Start: 2023-10-11 — End: 2023-10-11

## 2023-10-11 MED ORDER — PHENOL 1.4 % MT LIQD: 2.0000 | OROMUCOSAL | Status: DC | PRN

## 2023-10-11 MED ORDER — MENTHOL 3 MG MT LOZG: 1.0000 | LOZENGE | OROMUCOSAL | Status: DC | PRN

## 2023-10-11 MED ORDER — GABAPENTIN 100 MG PO CAPS
300.0000 mg | ORAL_CAPSULE | Freq: Two times a day (BID) | ORAL | Status: DC
  Administered 2023-10-11 – 2023-10-12 (×2): 300 mg via ORAL
  Filled 2023-10-11 (×2): qty 3

## 2023-10-11 SURGICAL SUPPLY — 62 items
APPLICATOR COTTON TIP 6 STRL (MISCELLANEOUS) ×2 IMPLANT
APPLICATOR COTTON TIP 6IN STRL (MISCELLANEOUS) ×1 IMPLANT
BAG COUNTER SPONGE SURGICOUNT (BAG) ×2 IMPLANT
BLADE SURG SZ11 CARB STEEL (BLADE) ×2 IMPLANT
CHLORAPREP W/TINT 26 (MISCELLANEOUS) ×2 IMPLANT
CLIP APPLIE 5 13 M/L LIGAMAX5 (MISCELLANEOUS) IMPLANT
COVER SURGICAL LIGHT HANDLE (MISCELLANEOUS) ×2 IMPLANT
COVER TIP SHEARS 8 DVNC (MISCELLANEOUS) IMPLANT
DEFOGGER SCOPE WARMER CLEARIFY (MISCELLANEOUS) IMPLANT
DRAIN CHANNEL 19F RND (DRAIN) IMPLANT
DRAIN PENROSE 0.5X18 (DRAIN) IMPLANT
DRAPE ARM DVNC X/XI (DISPOSABLE) ×8 IMPLANT
DRAPE COLUMN DVNC XI (DISPOSABLE) ×2 IMPLANT
DRAPE WARM FLUID 44X44 (DRAPES) ×2 IMPLANT
DRIVER NDL LRG 8 DVNC XI (INSTRUMENTS) ×4 IMPLANT
DRIVER NDL MEGA SUTCUT DVNCXI (INSTRUMENTS) ×2 IMPLANT
DRIVER NDLE LRG 8 DVNC XI (INSTRUMENTS) ×2 IMPLANT
DRIVER NDLE MEGA SUTCUT DVNCXI (INSTRUMENTS) ×1 IMPLANT
DRSG TEGADERM 2-3/8X2-3/4 SM (GAUZE/BANDAGES/DRESSINGS) ×10 IMPLANT
ELECT REM PT RETURN 15FT ADLT (MISCELLANEOUS) ×2 IMPLANT
ENDOLOOP SUT PDS II 0 18 (SUTURE) IMPLANT
EVACUATOR DRAINAGE 10X20 100CC (DRAIN) IMPLANT
EVACUATOR SILICONE 100CC (DRAIN) IMPLANT
FELT TEFLON 4 X1 (Mesh General) ×2 IMPLANT
FORCEPS PROGRASP DVNC XI (FORCEP) ×2 IMPLANT
GAUZE SPONGE 2X2 8PLY STRL LF (GAUZE/BANDAGES/DRESSINGS) ×2 IMPLANT
GLOVE ECLIPSE 8.0 STRL XLNG CF (GLOVE) ×4 IMPLANT
GLOVE INDICATOR 8.0 STRL GRN (GLOVE) ×4 IMPLANT
GOWN STRL REUS W/ TWL XL LVL3 (GOWN DISPOSABLE) ×8 IMPLANT
GRASPER SUT TROCAR 14GX15 (MISCELLANEOUS) IMPLANT
GRASPER TIP-UP FEN DVNC XI (INSTRUMENTS) ×2 IMPLANT
IRRIGATION SUCT STRKRFLW 2 WTP (MISCELLANEOUS) ×2 IMPLANT
KIT BASIN OR (CUSTOM PROCEDURE TRAY) ×2 IMPLANT
KIT TURNOVER KIT A (KITS) IMPLANT
MESH PHASIX RESORB RECT 10X15 (Mesh General) IMPLANT
NDL HYPO 22X1.5 SAFETY MO (MISCELLANEOUS) ×2 IMPLANT
NEEDLE HYPO 22X1.5 SAFETY MO (MISCELLANEOUS) ×1 IMPLANT
PACK CARDIOVASCULAR III (CUSTOM PROCEDURE TRAY) ×2 IMPLANT
PAD POSITIONING PINK XL (MISCELLANEOUS) ×2 IMPLANT
PENCIL SMOKE EVACUATOR (MISCELLANEOUS) IMPLANT
SCISSORS LAP 5X45 EPIX DISP (ENDOMECHANICALS) IMPLANT
SCISSORS MNPLR CVD DVNC XI (INSTRUMENTS) ×2 IMPLANT
SEAL UNIV 5-12 XI (MISCELLANEOUS) ×6 IMPLANT
SEALER VESSEL EXT DVNC XI (MISCELLANEOUS) ×2 IMPLANT
SOLUTION ELECTROSURG ANTI STCK (MISCELLANEOUS) ×2 IMPLANT
SPIKE FLUID TRANSFER (MISCELLANEOUS) ×2 IMPLANT
STOPCOCK 4 WAY LG BORE MALE ST (IV SETS) ×4 IMPLANT
SUT ETHIBOND 0 36 GRN (SUTURE) ×4 IMPLANT
SUT ETHIBOND NAB CT1 #1 30IN (SUTURE) ×6 IMPLANT
SUT MNCRL AB 4-0 PS2 18 (SUTURE) ×2 IMPLANT
SUT PROLENE 2 0 SH DA (SUTURE) IMPLANT
SUT STRATA PDS 2-0 23 CT-1 (SUTURE) IMPLANT
SUT VICRYL 0 UR6 27IN ABS (SUTURE) IMPLANT
SUT VLOC 3-0 9IN GRN (SUTURE) IMPLANT
SUTURE VLOC BRB 180 ABS3/0GR12 (SUTURE) IMPLANT
SYR 10ML LL (SYRINGE) ×2 IMPLANT
SYR 20ML LL LF (SYRINGE) ×2 IMPLANT
TOWEL OR 17X26 10 PK STRL BLUE (TOWEL DISPOSABLE) ×2 IMPLANT
TRAY FOLEY MTR SLVR 14FR STAT (SET/KITS/TRAYS/PACK) IMPLANT
TRAY FOLEY MTR SLVR 16FR STAT (SET/KITS/TRAYS/PACK) IMPLANT
TROCAR ADV FIXATION 5X100MM (TROCAR) ×2 IMPLANT
TUBING INSUFFLATION 10FT LAP (TUBING) ×2 IMPLANT

## 2023-10-11 NOTE — Op Note (Signed)
 10/11/2023  2:41 PM  PATIENT:  Joseph Walls  78 y.o. male  Patient Care Team: Lanae Pinal, MD as PCP - [facility] (Family Medicine) Candyce Champagne, MD as Consulting Physician ([facility]) Felecia Hopper, MD as Consulting Physician (Gastroenterology)  PRE-OPERATIVE DIAGNOSIS:  PARAESOPHAGEAL HIATAL HERNIA  POST-OPERATIVE DIAGNOSIS:  PARAESOPHAGEAL HIATAL HERNIA  PROCEDURE:  Procedure(s): REPAIR, HERNIA, PARAESOPHAGEAL, ROBOT-ASSISTED  1. ROBOTIC reduction of paraesophageal hiatal hernia 2. Type II mediastinal dissection. 2.5.  Bilateral diaphragm release 2.75.  Excision of distal esophageal lipoma 3. Primary repair of hiatal hernia over pledgets.  4. Anterior & posterior gastropexy. 5. Toupet (270 degree partial posterior x 4cm)  fundoplication 6. Mesh reinforcement with absorbable mesh  SURGEON:  Eddye Goodie, MD  ASSISTANT:  Maryland Snow, MD  An experienced assistant was required given the standard of surgical care given the complexity of the case.  This assistant was needed for exposure, dissection, suction, tissue approximation, retraction, perception, etc  ANESTHESIA: Walls  Experel & 0.25 % epinephrine  for a total volume of 20+30 = 50 milliliters and a broad field bilateral subcostal block  Estimated Blood Loss (EBL):   Total I/O In: 2350 [I.V.:1500; IV Piggyback:850] Out: 20 [Blood:20].   (See anesthesia record)  Delay start of Pharmacological VTE agent (>24hrs) due to concerns of significant anemia, surgical blood loss, or risk of bleeding?:  no  DRAINS: (None) and 19 Fr Blake drain with tip resting in the mediastinum  SPECIMENS:  Mediastinal hernia sac Esophageal lipoma.  DISPOSITION OF SPECIMEN:  Pathology  COUNTS:  Sponge, needle, & instrument counts CORRECT  PLAN OF CARE: Admit to inpatient   PATIENT DISPOSITION:  PACU - hemodynamically stable.  INDICATION:   Patient with symptomatic paraesophageal hiatal hernia.  The patient has had  extensive work-up & we feel the patient will benefit from repair:  The anatomy & physiology of the foregut and anti-reflux mechanism was discussed.  The pathophysiology of hiatal herniation and GERD was discussed.  Natural history risks without surgery was discussed.   The patient's symptoms are not adequately controlled by medicines and other non-operative treatments.  I feel the risks of no intervention will lead to serious problems that outweigh the operative risks; therefore, I recommended surgery to reduce the hiatal hernia out of the chest and fundoplication to rebuild the anti-reflux valve and control reflux better.  Need for a thorough workup to rule out the differential diagnosis and plan treatment was explained.  I explained laparoscopic techniques with possible need for an open approach.  Risks such as bleeding, infection, abscess, leak, need for further treatment, heart attack, death, and other risks were discussed.   I noted a good likelihood this will help address the problem.  Goals of post-operative recovery were discussed as well.  Possibility that this will not correct all symptoms was explained.  Post-operative dysphagia, need for short-term liquid & pureed diet, inability to vomit, possibility of reherniation, possible need for medicines to help control symptoms in addition to surgery were discussed.  We will work to minimize complications.   Educational handouts further explaining the pathology, treatment options, and dysphagia diet was given as well.  Questions were answered.  The patient expresses understanding & wishes to proceed with surgery.  OR FINDINGS:   Large paraesophageal hiatal hernia with 100% of the stomach and duodenal bulb with moderate volume omentum in the mediastinum.  There was a 13 x 12 cm hiatal defect.  Right and left crural component separation diaphragmatic releases were needed.  It  is a primary repair over pledgets.  Mesh reinforcement was used with PhasixT  Mesh: a knitted monofilament mesh scaffold using Poly-4-hydroxybutyrate (P4HB), a biologically derived, fully resorbable material  The patient has a Toupet (270 degree partial posterior x 4cm) fundoplication.  The patient has had anterior and posterior gastropexy.  DESCRIPTION:   Informed consent was confirmed.  The patient received IV antibiotics prior to incision.  The underwent Walls anesthesia without difficulty.  The patient was positioned in supine with arms tucked. The abdomen was prepped and draped in the sterile fashion.  Surgical time-out confirmed our plan.  I placed a 5 mm port in the left subcostal region using Varess entry technique with the patient in steep reverse Trendelenburg and left side up.  Entry was clean.  We induced carbon dioxide insufflation.  Camera inspection revealed no injury.  Under direct visualization, I placed extra ports.  I also placed a 5 mm port in the left subxiphoid region under direct visualization.  I removed that and placed an Omega-shaped rigid Nathanson liver retractor to lift the left lateral sector of the liver anteriorly to expose the esophageal hiatus.  This was secured to the bed using the iron man system.  The Xi robot was carefully docked and instruments placed and advanced under direct visualization.  We focused on dissection.  We are able to partially reduce the stomach and a large swath of omentum to reveal a very large hiatal hernia defect especially stretched out left crus.  We grasped the anterior mediastinal sac at the apex of the crus.  I scored through that and got into the anterior mediastinum.  I was able to free the mediastinal sac from its attachments to the pericardium and bilateral pleura using primarily focused gentle blunt dissection as well as focused vessel sealer dissection.  I transected phrenoesophageal attachments to the inner right crus, preserving a two centimeter cuff of mediastinal sac until I found the base of the crura.  I  then came around anteriorly on the left side and freed up the phrenoesophageal attachments of the mediastinal sac on the medial part of the left crus on the superior half.  I did careful mediastinal dissection to free the mediastinal sac.  With that, we could relieve the suction cup affect of the hernia sac and help reduce the stomach back down into the abdomen, flipped back approriately.    We ligated the short gastrics along the lesser curvature of the stomach about a third the way down and then came up proximally over the fundus.  We released the attachments of the stomach to the retroperitoneum until we were able to connect with the prior dissection on the left crus.  We completed the release of phrenoesophageal attachments to the medial part of the left crus down to its base.  With this, we had circumferential mobilization.    We placed the stomach and esophagus on axial tension.  I then did a Type II mediastinal dissection where I freed the esophagus from its attachments to the aorta, spine, pleura, and pericardium using primarily gentle blunt as well as focused ultrasonic dissection.  We saw the anterior & posterior vagus nerves intact.  We preserved it at all times.  I proceeded to dissect about 20 cm proximally into the mediastinum.  I ended up having to open up the right medial pleura and the sagittal plane  to get some release of tension.  With that I could straighten out the esophagus and get 5 cm of  intra-abdominal length of the esophagus at a best estimation.  With that the crura came together little better but there was still some tension.  Therefore I did a right crural release starting by skeletonizing and transecting the phrenic drain and the diaphragm just superolateral to the right crural esophageal hiatus.  Continue that release posteriorly in the coronal plane a centimeter away from the inferior vena cava.  Came through the tendinous layer and got 4-3 cm release.  Then did a similar crural  release on the left crus 5 cm lateral to the left crural edge also in the sagittal plane from between the spleen and diaphragm going anteriorly.  They gave another 4 cm release.  With that the crura came together better with minimal tension.  I ended up opening up the left pleura in the sagittal plane anterior medially parallel to the esophagus and that help relax the diaphragm to help get it, get there as well.  I freed the anterior mediastinal sac off the esophagus & stomach.  We saw the anterior vagus nerve and freed the sac off of the vagus.  I dissected out & removed the fatty  epiphrenic pads at the esophagogastric junction. With that, I could better define the esophagogastric junction.  I confirmed the the patient had 5 cm of intra-abdominal esophageal length off tension.  I brought the fundus of the stomach posterior to the esophagus over to the right side.  The wrap was mobile with the classic shoe shine maneuver.  Wrap became together gently.  We reflected the stomach left laterally and closed the esophageal hiatus using #1 Ethibond stitch using horizontal mattress stitches with pledgets on both sides.  I did that x2 stitches.  I then did a third #1 Ethibond horizontal mattress suture to close the hiatal hernia at the most superior junction without pledgets.  The crura had thin but decent substance and they came together well without any tension.  Because of the larger defect, I reinforced the repair using a 15x10 cm Phasix mesh.  I cut out a 2x4 cm part of mesh in the middle third of the mesh such that the mesh had a broad U shape transversely, one narrow tail 5 cm wide & the other broader, 8 cm wide.. We brought the mesh in and laid it over the crural repair, tails anterior over the crura.  I tucked the broader "U" tail of the mesh between the left diaphragm and the spleen, the narrower "U" tail over the right crus.  I secured to the left lateral and left superior sides of the broader "U" tail to  the left diaphragm band with 2-0 V lock running suture.  Secured the narrow towel to the right crus using a running 2-0 V-lock suture as well  I brought the fundus of the stomach behind the esophagus and cardia to set up a fundoplication wrap.  I did a posterior gastropexy x3  by taking of #1 Ethibond interrupted stitches to the posterior part of the right side of the wrap and thru the mesh and crural closure.  I placed a stitch on the inner right crus, superior part of the wrap, superior lateral esophagus and tied that down for a right anterior gastropexy.  I did a similar stitch on the left anterior side as well.  That way the stomach covered the mesh and protected it from any esophageal exposure.  With the anterior and posterior gastropexies, stomach laid well for a fundoplication wrap.  I then did  a classic 4cm Toupet fundoplication on the true esophagus above the cardia using 0 Ethibond stitch in the left superior side of the wrap, apex of the left inner crus then left anterior esophagus and tied that down to do a left anterior gastropexy.  Did a mirror-image stitch on the right side to do a right anterior gastropexy.  I then did 2 more distal pairs of suture between the inner part of the wrap and the anterolateral esophagus.  That way there were 3 total pairs of fundoplication sutures offering a posterior 270 degree wrap.  Measured at 4 cm.  Classic Toupet fundoplication.    The wrap was soft and floppy.  I placed a drain as noted above.  I did irrigation and ensured hemostasis.  I saw no evidence of any leak or perforation or other abnormality.  I removed the [facility] liver retractor under direct visualization.  I evacuated carbon dioxide and removed the ports.  The skin was closed with Monocryl and sterile dressings applied.  The patient is being extubated and brought back to the recovery room.  I discussed postop care in detail with the patient and family in in the office.  Discussed again with the  patient in the holding area.  I discussed operative findings, updated the patient's status, discussed probable steps to recovery, and gave postoperative recommendations to the patient's close friend, Mylan Schwarz .  Recommendations were made.  Questions were answered.  He expressed understanding & appreciation.   Eddye Goodie, M.D., F.A.C.S. Gastrointestinal and Minimally Invasive Surgery [facility] [address], [address] [place], [place] [address] [phone] Main / Paging

## 2023-10-11 NOTE — Discharge Instructions (Signed)
 EATING AFTER YOUR ESOPHAGEAL SURGERY (Stomach Fundoplication, Hiatal Hernia repair, Achalasia surgery, etc)  ######################################################################  EAT Start with a pureed / full liquid diet (see below) Gradually transition to a high fiber diet with a fiber supplement over the next month after discharge.    WALK Walk an hour a day.  Control your pain to do that.    CONTROL PAIN Control pain so that you can walk, sleep, tolerate sneezing/coughing, go up/down stairs.  HAVE A BOWEL MOVEMENT DAILY Keep your bowels regular to avoid problems.  OK to try a laxative to override constipation.  OK to use an antidairrheal to slow down diarrhea.  Call if not better after 2 tries  CALL IF YOU HAVE PROBLEMS/CONCERNS Call if you are still struggling despite following these instructions. Call if you have concerns not answered by these instructions  ######################################################################   After your esophageal surgery, expect some sticking with swallowing over the next 1-2 months.    If food sticks when you eat, it is called "dysphagia".  This is due to swelling around your esophagus at the wrap & hiatal diaphragm repair.  It will gradually ease off over the next few months.  To help you through this temporary phase, we start you out on a pureed (blenderized) diet.  Your first meal in the hospital was thin liquids.  You should have been given a pureed diet by the time you left the hospital.  We ask patients to stay on a pureed diet for the first 2-3 weeks to avoid anything getting "stuck" near your recent surgery.  Don't be alarmed if your ability to swallow doesn't progress according to this plan.  Everyone is different and some diets can advance more or less quickly.    It is often helpful to crush your medications or split them as they can sometimes stick, especially the first week or so.   Some BASIC RULES to follow are: Maintain  an upright position whenever eating or drinking. Take small bites - just a teaspoon size bite at a time. Eat slowly.  It may also help to eat only one food at a time. Consider nibbling through smaller, more frequent meals & avoid the urge to eat BIG meals Do not push through feelings of fullness, nausea, or bloatedness Do not mix solid foods and liquids in the same mouthful Try not to "wash foods down" with large gulps of liquids. Avoid carbonated (bubbly/fizzy) drinks.   Avoid foods that make you feel gassy or bloated.  Start with bland foods first.  Wait on trying greasy, fried, or spicy meals until you are tolerating more bland solids well. Understand that it will be hard to burp and belch at first.  This gradually improves with time.  Expect to be more gassy/flatulent/bloated initially.  Walking will help your body manage it better. Consider using medications for bloating that contain simethicone  such as  Maalox or Gas-X  Consider crushing her medications, especially smaller pills.  The ability to swallow pills should get easier after a few weeks Eat in a relaxed atmosphere & minimize distractions. Avoid talking while eating.   Do not use straws. Following each meal, sit in an upright position (90 degree angle) for 60 to 90 minutes.  Going for a short walk can help as well If food does stick, don't panic.  Try to relax and let the food pass on its own.  Sipping WARM LIQUID such as strong hot black tea can also help slide it down.  Be gradual in changes & use common sense:  -If you easily tolerating a certain "level" of foods, advance to the next level gradually -If you are having trouble swallowing a particular food, then avoid it.   -If food is sticking when you advance your diet, go back to thinner previous diet (the lower LEVEL) for 1-2 days.  LEVEL 1 = PUREED DIET  Do for the first 2 WEEKS AFTER SURGERY  -Foods in this group are pureed or blenderized to a smooth, mashed  potato-like consistency.  -If necessary, the pureed foods can keep their shape with the addition of a thickening agent.   -Meat should be pureed to a smooth, pasty consistency.  Hot broth or gravy may be added to the pureed meat, approximately 1 oz. of liquid per 3 oz. serving of meat. -CAUTION:  If any foods do not puree into a smooth consistency, swallowing will be more difficult.  (For example, nuts or seeds sometimes do not blend well.)  Hot Foods Cold Foods  Pureed scrambled eggs and cheese Pureed cottage cheese  Baby cereals Thickened juices and nectars  Thinned cooked cereals (no lumps) Thickened milk or eggnog  Pureed [place] toast or pancakes Ensure  Mashed potatoes Ice cream  Pureed parsley, au gratin, scalloped potatoes, candied sweet potatoes Fruit or [place] ice, sherbet  Pureed buttered or alfredo noodles Plain yogurt  Pureed vegetables (no corn or peas) Instant breakfast  Pureed soups and creamed soups Smooth pudding, mousse, custard  Pureed scalloped apples Whipped gelatin  Gravies Sugar, syrup, honey, jelly  Sauces, cheese, tomato, barbecue, white, creamed Cream  Any baby food Creamer  Alcohol in moderation (not beer or champagne) Margarine  Coffee or tea Mayonnaise   Ketchup, mustard   Apple sauce   SAMPLE MENU:  PUREED DIET Breakfast Lunch Dinner  Orange juice, 1/2 cup Cream of wheat, 1/2 cup Pineapple juice, 1/2 cup Pureed [place], barley soup, 3/4 cup Pureed Hawaiian chicken, 3 oz  Scrambled eggs, mashed or blended with cheese, 1/2 cup Tea or coffee, 1 cup  Whole milk, 1 cup  Non-dairy creamer, 2 Tbsp. Mashed potatoes, 1/2 cup Pureed cooled broccoli, 1/2 cup Apple sauce, 1/2 cup Coffee or tea Mashed potatoes, 1/2 cup Pureed spinach, 1/2 cup Frozen yogurt, 1/2 cup Tea or coffee      LEVEL 2 = SOFT DIET  After your first 2 weeks, you can advance to a soft diet.   Keep on this diet until everything goes down easily.  Hot Foods Cold Foods  White fish  Cottage cheese  Stuffed fish Junior baby fruit  Baby food meals Semi thickened juices  Minced soft cooked, scrambled, poached eggs nectars  Souffle & omelets Ripe mashed bananas  Cooked cereals Canned fruit, pineapple sauce, milk  potatoes Milkshake  Buttered or Alfredo noodles Custard  Cooked cooled vegetable Puddings, including tapioca  Sherbet Yogurt  Vegetable soup or alphabet soup Fruit ice, [place] ice  Gravies Whipped gelatin  Sugar, syrup, honey, jelly Junior baby desserts  Sauces:  Cheese, creamed, barbecue, tomato, white Cream  Coffee or tea Margarine   SAMPLE MENU:  LEVEL 2 Breakfast Lunch Dinner  Orange juice, 1/2 cup Oatmeal, 1/2 cup Scrambled eggs with cheese, 1/2 cup Decaffeinated tea, 1 cup Whole milk, 1 cup Non-dairy creamer, 2 Tbsp Pineapple juice, 1/2 cup Minced beef, 3 oz Gravy, 2 Tbsp Mashed potatoes, 1/2 cup Minced fresh broccoli, 1/2 cup Applesauce, 1/2 cup Coffee, 1 cup [place], barley soup, 3/4 cup Minced Hawaiian chicken, 3 oz  Mashed potatoes, 1/2 cup Cooked spinach, 1/2 cup Frozen yogurt, 1/2 cup Non-dairy creamer, 2 Tbsp      LEVEL 3 = CHOPPED DIET  -After all the foods in level 2 (soft diet) are passing through well you should advance up to more chopped foods.  -It is still important to cut these foods into small pieces and eat slowly.  Hot Foods Cold Foods  Poultry Cottage cheese  Chopped Swedish meatballs Yogurt  Meat salads (ground or flaked meat) Milk  Flaked fish (tuna) Milkshakes  Poached or scrambled eggs Soft, cold, dry cereal  Souffles and omelets Fruit juices or nectars  Cooked cereals Chopped canned fruit  Chopped [place] toast or pancakes Canned fruit cocktail  Noodles or pasta (no rice) Pudding, mousse, custard  Cooked vegetables (no frozen peas, corn, or mixed vegetables) Green salad  Canned small sweet peas Ice cream  Creamed soup or vegetable soup Fruit ice, [place] ice  Pureed vegetable soup or alphabet soup  Non-dairy creamer  Ground scalloped apples Margarine  Gravies Mayonnaise  Sauces:  Cheese, creamed, barbecue, tomato, white Ketchup  Coffee or tea Mustard   SAMPLE MENU:  LEVEL 3 Breakfast Lunch Dinner  Orange juice, 1/2 cup Oatmeal, 1/2 cup Scrambled eggs with cheese, 1/2 cup Decaffeinated tea, 1 cup Whole milk, 1 cup Non-dairy creamer, 2 Tbsp Ketchup, 1 Tbsp Margarine, 1 tsp Salt, 1/4 tsp Sugar, 2 tsp Pineapple juice, 1/2 cup Ground beef, 3 oz Gravy, 2 Tbsp Mashed potatoes, 1/2 cup Cooked spinach, 1/2 cup Applesauce, 1/2 cup Decaffeinated coffee Whole milk Non-dairy creamer, 2 Tbsp Margarine, 1 tsp Salt, 1/4 tsp Pureed turkey, barley soup, 3/4 cup Barbecue chicken, 3 oz Mashed potatoes, 1/2 cup Ground fresh broccoli, 1/2 cup Frozen yogurt, 1/2 cup Decaffeinated tea, 1 cup Non-dairy creamer, 2 Tbsp Margarine, 1 tsp Salt, 1/4 tsp Sugar, 1 tsp    LEVEL 4:  REGULAR FOODS  -Foods in this group are soft, moist, regularly textured foods.   -This level includes meat and breads, which tend to be the hardest things to swallow.   -Eat very slowly, chew well and continue to avoid carbonated drinks. -most people are at this level in 4-6 weeks  Hot Foods Cold Foods  Baked fish or skinned Soft cheeses - cottage cheese  Souffles and omelets Cream cheese  Eggs Yogurt  Stuffed shells Milk  Spaghetti with meat sauce Milkshakes  Cooked cereal Cold dry cereals (no nuts, dried fruit, coconut)  [place] toast or pancakes Crackers  Buttered toast Fruit juices or nectars  Noodles or pasta (no rice) Canned fruit  Potatoes (all types) Ripe bananas  Soft, cooked vegetables (no corn, lima, or baked beans) Peeled, ripe, fresh fruit  Creamed soups or vegetable soup Cakes (no nuts, dried fruit, coconut)  Canned chicken noodle soup Plain doughnuts  Gravies Ice cream  Bacon dressing Pudding, mousse, custard  Sauces:  Cheese, creamed, barbecue, tomato, white Fruit ice, [place] ice, sherbet   Decaffeinated tea or coffee Whipped gelatin  Pork chops Regular gelatin   Canned fruited gelatin molds   Sugar, syrup, honey, jam, jelly   Cream   Non-dairy   Margarine   Oil   Mayonnaise   Ketchup   Mustard   TROUBLESHOOTING IRREGULAR BOWELS  1) Avoid extremes of bowel movements (no bad constipation/diarrhea)  2) Miralax  17gm mixed in 8oz. water or juice-daily. May use BID as needed.  3) Gas-x,Phazyme, etc. as needed for gas & bloating.  4) Soft,bland diet. No spicy,greasy,fried foods.  5) Prilosec over-the-counter  as needed  6) May hold gluten/wheat products from diet to see if symptoms improve.  7) May try probiotics (Align, Activa, etc) to help calm the bowels down  7) If symptoms become worse call back immediately.    If you have any questions please call our office at [facility]: [phone].   ################################################################  LAPAROSCOPIC SURGERY: POST OP INSTRUCTIONS  ######################################################################  EAT Gradually transition to a high fiber diet with a fiber supplement over the next few weeks after discharge.  Start with a pureed / full liquid diet (see below)  WALK Walk an hour a day.  Control your pain to do that.    CONTROL PAIN Control pain so that you can walk, sleep, tolerate sneezing/coughing, go up/down stairs.  HAVE A BOWEL MOVEMENT DAILY Keep your bowels regular to avoid problems.  OK to try a laxative to override constipation.  OK to use an antidairrheal to slow down diarrhea.  Call if not better after 2 tries  CALL IF YOU HAVE PROBLEMS/CONCERNS Call if you are still struggling despite following these instructions. Call if you have concerns not answered by these instructions  ######################################################################    DIET: Follow a light bland diet & liquids the first 24 hours after arrival home, such as soup, liquids,  starches, etc.  Be sure to drink plenty of fluids.  Quickly advance to a usual solid diet within a few days.  Avoid fast food or heavy meals as your are more likely to get nauseated or have irregular bowels.  A low-fat, high-fiber diet for the rest of your life is ideal.  Take your usually prescribed home medications unless otherwise directed. Blood thinners:  You can restart any strong blood thinners after the second postoperative day  for example: COUMADIN (warfarin), XERELTO (rivaroxaban), ELIQUIS (apixaban), PLAVIX (clopidigrel), BRILINTA (ticagrelor), EFFIENT (prasugrel), PRADAXA (dabigatran), etc  Continue aspirin before & after surgery..     Some oozing/bleeding the first 1-2 weeks is common but should taper down & be small volume.    If you are passing many large clots or having uncontrolling bleeding, call your surgeon  PAIN CONTROL: Pain is best controlled by a usual combination of three different methods TOGETHER: Ice/Heat Over the counter pain medication Prescription pain medication Most patients will experience some swelling and bruising around the incisions.  Ice packs or heating pads (30-60 minutes up to 6 times a day) will help. Use ice for the first few days to help decrease swelling and bruising, then switch to heat to help relax tight/sore spots and speed recovery.  Some people prefer to use ice alone, heat alone, alternating between ice & heat.  Experiment to what works for you.  Swelling and bruising can take several weeks to resolve.   It is helpful to take an over-the-counter pain medication regularly for the first few weeks.  Choose one of the following that works best for you: Naproxen (Aleve, etc)  Two 220mg  tabs twice a day Ibuprofen (Advil, etc) Three 200mg  tabs four times a day (every meal & bedtime) Acetaminophen  (Tylenol , etc) 500-650mg  four times a day (every meal & bedtime) A  prescription for pain medication (such as oxycodone , hydrocodone, tramadol ,  gabapentin , methocarbamol , etc) should be given to you upon discharge.  Take your pain medication as prescribed.  If you are having problems/concerns with the prescription medicine (does not control pain, nausea, vomiting, rash, itching, etc), please call us  [phone] to see if we need to switch you to a different pain medicine  that will work better for you and/or control your side effect better. If you need a refill on your pain medication, please give us  48 hour notice.  contact your pharmacy.  They will contact our office to request authorization. Prescriptions will not be filled after 5 pm or on week-ends  AVOID GETTING CONSTIPATED.   a.  Between the surgery and the pain medications, it is common to experience some constipation.  b.  Drink plenty of liquids c   ake a fiber supplement 2 times day (such as Metamucil, Citrucel, FiberCon, MiraLax , etc) to have a bowel movement every day. d.  If you have not had a BM by 2 days after surgery: -drink liquids only until you have a bowel movement - take MiraLAX  2 doses every 2 hours until you have a bowel movement   Watch out for diarrhea.   If you have many loose bowel movements, simplify your diet to bland foods & liquids for a few days.   Stop any stool softeners and decrease your fiber supplement.   Switching to mild anti-diarrheal medications (Kayopectate, Pepto Bismol) can help.   If this worsens or does not improve, please call us .  Wash / shower every day.  You may shower over the dressings as they are waterproof.  Continue to shower over incision(s) after the dressing is off.  It is good for closed incisions and even open wounds to be washed every day.  Shower every day.  Short baths are fine.  Wash the incisions and wounds clean with soap & water.    You may leave closed incisions open to air if it is dry.   You may cover the incision with clean gauze & replace it after your daily shower for comfort.  TEGADERM:  You have clear gauze  band-aid dressings over your closed incision(s).  Remove the dressings 2 days after surgery = 4/26 Saturday.    ACTIVITIES as tolerated:   You may resume regular (light) daily activities beginning the next day--such as daily self-care, walking, climbing stairs--gradually increasing activities as tolerated.  If you can walk 30 minutes without difficulty, it is safe to try more intense activity such as jogging, treadmill, bicycling, low-impact aerobics, swimming, etc. Save the most intensive and strenuous activity for last such as sit-ups, heavy lifting, contact sports, etc  Refrain from any heavy lifting or straining until you are off narcotics for pain control.   DO NOT PUSH THROUGH PAIN.  Let pain be your guide: If it hurts to do something, don't do it.  Pain is your body warning you to avoid that activity for another week until the pain goes down. You may drive when you are no longer taking prescription pain medication, you can comfortably wear a seatbelt, and you can safely maneuver your car and apply brakes. You may have sexual intercourse when it is comfortable.  FOLLOW UP in our office Please call [facility] at [phone] to set up an appointment to see your surgeon in the office for a follow-up appointment approximately 2-3 weeks after your surgery. Make sure that you call for this appointment the day you arrive home to insure a convenient appointment time.  10. IF YOU HAVE DISABILITY OR FAMILY LEAVE FORMS, BRING THEM TO THE OFFICE FOR PROCESSING.  DO NOT GIVE THEM TO YOUR DOCTOR.   WHEN TO CALL US  [phone]: Poor pain control Reactions / problems with new medications (rash/itching, nausea, etc)  Fever over 101.5 F (38.5 C) Inability to urinate  Nausea and/or vomiting Worsening swelling or bruising Continued bleeding from incision. Increased pain, redness, or drainage from the incision   The clinic staff is available to answer your questions during regular business hours  (8:30am-5pm).  Please don't hesitate to call and ask to speak to one of our nurses for clinical concerns.   If you have a medical emergency, go to the nearest emergency room or call 911.  A surgeon from [facility] is always on call at the [facility], [place] [address], [address], [place], [place]  [address] ? MAIN: [phone] ? TOLL FREE: [phone] ?  FAX [phone] [email or web address]  ##############################################################

## 2023-10-11 NOTE — H&P (Signed)
 10/11/2023   REFERRING PHYSICIAN: Felecia Hopper, MD  Patient Care Team: Lanae Pinal, MD as PCP - General (Family Medicine) Itzayanna Kaster, Annye Kinds, MD as Consulting Provider (General Surgery) Felecia Hopper, MD (Gastroenterology)  PROVIDER: Girtha Lama, MD  [facility] MRN: [id number] DOB: [birth year]  SUBJECTIVE   Chief Complaint: No chief complaint on file.   Joseph Walls is a 78 y.o. male  who is seen today as an office consultation  at the request of DrVeronda Goody  for evaluation of giant hiatal hernia.  History of Present Illness: "I'm here to have surgery"  78 year old male. Rather active and independent. Lives by himself. He started having markedly decreased appetite several months ago. Lost almost 20 pounds. Abdominal bloating postprandial. Concerning. Discussed with primary care. CAT scan done which revealed giant hiatal hernia. Referred to see gastroenterology. Dr. Veronda Goody did EGD which confirmed all of his stomach up in his chest twisted with gastritis. Surgical consultation offered.  Patient notes that he got some of his appetite back and has been trying to gain weight. He does struggle with constipation so take stool softeners that sometimes help. He can walk at least 15 minutes without difficulty. No exertional chest pain but does get a little short of breath. Denies any asthma or pneumonia. Denies any hematemesis. Will get some early satiety and bloating. Denies any major heartburn or reflux but does state he is on a PPI every day to help control this. Does not recall any dysphagia to solids or liquids. He does not smoke. No diabetes. No sleep apnea. Some mild hypertension. No prior abdominal surgery.  Medical History:  Past Medical History:  Diagnosis Date  GERD (gastroesophageal reflux disease)  Glaucoma (increased eye pressure)  History of cancer   Patient Active Problem List  Diagnosis  Mesenteroaxial gastric volvulus  Incarcerated hiatal  hernia  Unintentional weight loss of more than 10 pounds in 90 days   Past Surgical History:  Procedure Laterality Date  Knee Surgery    No Known Allergies  Current Outpatient Medications on File Prior to Visit  Medication Sig Dispense Refill  amLODIPine  (NORVASC ) 10 MG tablet Take 10 mg by mouth once daily  atorvastatin  (LIPITOR) 20 MG tablet  ferrous sulfate 325 (65 FE) MG tablet Take 325 mg by mouth once daily  lamoTRIgine  (LAMICTAL ) 200 MG tablet Take 200 mg by mouth once daily  losartan (COZAAR) 100 MG tablet Take 100 mg by mouth once daily  omeprazole (PRILOSEC) 40 MG DR capsule TAKE 1 CAPSULE 30 MINUTES BEFORE BREAKFAST EVERY DAY AS NEEDED  tamsulosin  (FLOMAX ) 0.4 mg capsule Take 0.4 mg by mouth once daily   No current facility-administered medications on file prior to visit.   History reviewed. No pertinent family history.   Social History   Tobacco Use  Smoking Status Former  Types: Cigarettes  Smokeless Tobacco Never    Social History   Socioeconomic History  Marital status: Single  Tobacco Use  Smoking status: Former  Types: Cigarettes  Smokeless tobacco: Never  Vaping Use  Vaping status: Never Used  Substance and Sexual Activity  Alcohol use: Yes  Drug use: Never   Social Drivers of Health   Housing Stability: Unknown (09/04/2023)  Housing Stability Vital Sign  Homeless in the Last Year: No   ############################################################  Review of Systems: A complete review of systems (ROS) was obtained from the patient.  We have reviewed this information and discussed as appropriate with the patient.  See HPI as well for other pertinent ROS.  Constitutional: No fevers, chills, sweats. Weight stable Eyes: No vision changes, No discharge HENT: No sore throats, nasal drainage Lymph: No neck swelling, No bruising easily Pulmonary: No cough, productive sputum CV: No orthopnea, PND . No exertional chest/neck/shoulder/arm pain.  Patient can walk 20 minutes without difficulty.   GI: No personal nor family history of GI/colon cancer, inflammatory bowel disease, irritable bowel syndrome, allergy such as Celiac Sprue, dietary/dairy problems, colitis, ulcers nor gastritis. No recent sick contacts/gastroenteritis. No travel outside the country. No changes in diet.  Renal: No UTIs, No hematuria Genital: No drainage, bleeding, masses Musculoskeletal: No severe joint pain. Good ROM major joints Skin: No sores or lesions Heme/Lymph: No easy bleeding. No swollen lymph nodes Neuro: No active seizures. No facial droop Psych: No hallucinations. No agitation  OBJECTIVE   Vitals:  09/04/23 1009  BP: (!) 151/89  Pulse: 70  Temp: 36.6 C (97.9 F)  SpO2: 98%  Weight: 81.2 kg (179 lb)  Height: 182.9 cm (6')  PainSc: 0-No pain   Body mass index is 24.28 kg/m.  PHYSICAL EXAM:  Constitutional: Not cachectic. Hygeine adequate. Vitals signs as above.  Eyes: No glasses. Vision adequate,Pupils reactive, normal extraocular movements. Sclera nonicteric Neuro: CN II-XII intact. No major focal sensory defects. No major motor deficits. Lymph: No head/neck/groin lymphadenopathy Psych: No severe agitation. No severe anxiety. Judgment & insight Adequate, Oriented x4, HENT: Normocephalic, Mucus membranes moist. No thrush. Hearing: adequate Neck: Supple, No tracheal deviation. No obvious thyromegaly Chest: No pain to chest wall compression. Good respiratory excursion. No audible wheezing CV: Pulses intact. regular. No major extremity edema Ext: No obvious deformity or contracture. Edema: Not present. No cyanosis Skin: No major subcutaneous nodules. Warm and dry Musculoskeletal: Severe joint rigidity not present. No obvious clubbing. No digital petechiae. Mobility: no assist device moving easily without restrictions  Abdomen: Flat Soft. Nondistended. Nontender. Hernia: Not present. Diastasis recti: Not present. No hepatomegaly. No  splenomegaly.  Genital/Pelvic: Inguinal hernia: Not present. Inguinal lymph nodes: without lymphadenopathy nor hidradenitis.   Rectal: (Deferred)    ###################################################################  Labs, Imaging and Diagnostic Testing:  Located in 'Care Everywhere' section of Epic EMR chart  PRIOR [facility] NOTES:  Not applicable  SURGERY NOTES:  Not applicable  PATHOLOGY:  Not applicable  Assessment and Plan:  DIAGNOSES:  Diagnoses and all orders for this visit:  Incarcerated hiatal hernia  Mesenteroaxial gastric volvulus  Unintentional weight loss of more than 10 pounds in 90 days    ASSESSMENT/PLAN  Elderly but healthy active male with episode of early satiety bloating and unintentional weight loss last year with 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 Already on PPI.  He does not seem horrifically symptomatic right now but the episode of unintentional weight loss due to decreased appetite and bloating is concerning. Some subtle worsening dyspnea on exertion is not surprising since his stomach is collapsing most of his left long. While he is not young, he is definitely healthy for his age and I worry as years go by this will cause more problems.  Pt could not tolerate manometry.  Will plan partial fundoplication (Toupet) most likely  I offered to be aggressive and do hiatal hernia repair. Robotic reduction repair. He may need a diaphragm release and mesh patching if it is a large defect. We will see.   The anatomy & physiology of the foregut and anti-reflux mechanism was discussed. The pathophysiology of hiatal herniation and GERD was discussed. Natural history risks without surgery was discussed. The  patient's symptoms are not adequately controlled by medicines and other non-operative treatments. I feel the risks of no intervention will lead to serious problems that outweigh the operative risks; therefore, I  recommended surgery to reduce the hiatal hernia out of the chest and fundoplication to rebuild the anti-reflux valve and control reflux better. Need for a thorough workup to rule out the differential diagnosis and plan treatment was explained. I explained minimally invasive techniques with possible need for an open approach.  Risks such as bleeding, infection, abscess, leak,injury to other organs, need for repair of tissues / organs, need for further treatment, stroke, heart attack, death, and other risks were discussed. I noted a good likelihood this will help address the problem. Goals of post-operative recovery were discussed as well. Possibility that this will not correct all symptoms was explained. Post-operative dysphagia, need for short-term liquid & pureed diet, inability to vomit, possibility of reherniation, possible need for medicines to help control symptoms in addition to surgery were discussed. We will work to minimize complications. Educational handouts further explaining the pathology, treatment options, and dysphagia diet was given as well. Questions were answered. The patient expresses understanding & wishes to proceed with surgery. I stressed the importance of asking for help since he normally lives by himself. Just want to be safe. He does have family in [place] and friends to help him recover the first few weeks .

## 2023-10-11 NOTE — Anesthesia Postprocedure Evaluation (Signed)
 Anesthesia Post Note  Patient: Joseph Walls  Procedure(s) Performed: REPAIR, HERNIA, PARAESOPHAGEAL, ROBOT-ASSISTED   1. ROBOTIC reduction of paraesophageal hiatal hernia 2. Type II mediastinal dissection. 2.5.  Bilateral diaphragm release 2.75.  Excision of distal esophageal lipoma 3. Primary repair of hiatal hernia over pledgets.  4. Anterior & posterior gastropexy. 5. Toupet (270 degree partial posterior x 4cm)  fundoplication 6. Mesh reinforcement with absorbable mesh     Patient location during evaluation: PACU Anesthesia Type: General Level of consciousness: awake Pain management: pain level controlled Vital Signs Assessment: post-procedure vital signs reviewed and stable Respiratory status: spontaneous breathing, nonlabored ventilation and respiratory function stable Cardiovascular status: blood pressure returned to baseline and stable Postop Assessment: no apparent nausea or vomiting Anesthetic complications: no   No notable events documented.  Last Vitals:  Vitals:   10/11/23 1545 10/11/23 1600  BP: 113/69 115/71  Pulse: 69 70  Resp: 17 17  Temp: 36.4 C   SpO2: 96% 95%    Last Pain:  Vitals:   10/11/23 1545  TempSrc:   PainSc: 0-No pain                 Conard Decent

## 2023-10-11 NOTE — Transfer of Care (Signed)
 Immediate Anesthesia Transfer of Care Note  Patient: Joseph Walls  Procedure(s) Performed: REPAIR, HERNIA, PARAESOPHAGEAL, ROBOT-ASSISTED   1. ROBOTIC reduction of paraesophageal hiatal hernia 2. Type II mediastinal dissection. 2.5.  Bilateral diaphragm release 2.75.  Excision of distal esophageal lipoma 3. Primary repair of hiatal hernia over pledgets.  4. Anterior & posterior gastropexy. 5. Toupet (270 degree partial posterior x 4cm)  fundoplication 6. Mesh reinforcement with absorbable mesh  Patient Location: PACU  Anesthesia Type:General  Level of Consciousness: drowsy  Airway & Oxygen Therapy: Patient Spontanous Breathing and Patient connected to face mask oxygen  Post-op Assessment: Report given to RN and Post -op Vital signs reviewed and stable  Post vital signs: Reviewed and stable  Last Vitals:  Vitals Value Taken Time  BP 104/82 10/11/23 1455  Temp    Pulse 66 10/11/23 1500  Resp 26 10/11/23 1500  SpO2 100 % 10/11/23 1500  Vitals shown include unfiled device data.  Last Pain:  Vitals:   10/11/23 0919  TempSrc:   PainSc: 0-No pain      Patients Stated Pain Goal: 5 (10/11/23 0912)  Complications: No notable events documented.

## 2023-10-11 NOTE — Care Management Obs Status (Signed)
 MEDICARE OBSERVATION STATUS NOTIFICATION   Patient Details  Name: RAYDEL HOSICK MRN: [id number] Date of Birth: 08-24-1945   Medicare Observation Status Notification Given:  Yes    Naoma Bacca, RN 10/11/2023, 4:44 PM

## 2023-10-11 NOTE — Interval H&P Note (Signed)
 History and Physical Interval Note:  10/11/2023 10:26 AM  Joseph Walls  has presented today for surgery, with the diagnosis of PARAESOPHAGEAL HIATAL HERNIA.  The various methods of treatment have been discussed with the patient and family. After consideration of risks, benefits and other options for treatment, the patient has consented to  Procedure(s) with comments: REPAIR, HERNIA, PARAESOPHAGEAL, ROBOT-ASSISTED (N/A) - ROBOTIC REPAIR OF PARAESOPHAGEAL HIATAL HERNIA WITH FUNDOPLICATION as a surgical intervention.  The patient's history has been reviewed, patient examined, no change in status, stable for surgery.  I have reviewed the patient's chart and labs.  Questions were answered to the patient's satisfaction.    I have re-reviewed the the patient's records, history, medications, and allergies.  I have re-examined the patient.  I again discussed intraoperative plans and goals of post-operative recovery.  The patient agrees to proceed.  BARKLEY KRATOCHVIL  1945/10/07 [id number]  Patient Care Team: Lanae Pinal, MD as PCP - Walls (Family Medicine) Candyce Champagne, MD as Consulting Physician ([facility]) Felecia Hopper, MD as Consulting Physician (Gastroenterology)  There are no active problems to display for this patient.   Past Medical History:  Diagnosis Date   Bipolar disorder (HCC)    History of hiatal hernia    Melanoma (HCC)    Left mid back    Past Surgical History:  Procedure Laterality Date   CATARACT EXTRACTION Bilateral    KNEE SURGERY Right     Social History   Socioeconomic History   Marital status: Single    Spouse name: Not on file   Number of children: Not on file   Years of education: Not on file   Highest education level: Not on file  Occupational History   Not on file  Tobacco Use   Smoking status: Never    Passive exposure: Never   Smokeless tobacco: Never  Vaping Use   Vaping status: Never Used  Substance and Sexual Activity    Alcohol use: Yes    Comment: rarely   Drug use: Not Currently   Sexual activity: Not on file  Other Topics Concern   Not on file  Social History Narrative   Not on file   Social Drivers of Health   Financial Resource Strain: Not on file  Food Insecurity: Not on file  Transportation Needs: Not on file  Physical Activity: Not on file  Stress: Not on file  Social Connections: Not on file  Intimate Partner Violence: Not on file    History reviewed. No pertinent family history.  Medications Prior to Admission  Medication Sig Dispense Refill Last Dose/Taking   ALPRAZolam  (XANAX ) 0.25 MG tablet Take 0.25 mg by mouth daily as needed for anxiety.   10/10/2023 Evening   amLODipine  (NORVASC ) 10 MG tablet Take 10 mg by mouth daily.   10/11/2023 at  7:30 AM   atorvastatin  (LIPITOR) 20 MG tablet Take 20 mg by mouth daily.   10/11/2023 at  7:30 AM   cyanocobalamin  (VITAMIN B12) 500 MCG tablet Take 500 mcg by mouth daily.   10/04/2023   dorzolamide -timolol  (COSOPT ) 2-0.5 % ophthalmic solution Place 1 drop into the left eye 2 (two) times daily.   10/11/2023 at  7:30 AM   ferrous sulfate 325 (65 FE) MG tablet Take 325 mg by mouth daily.   10/04/2023   lamoTRIgine  (LAMICTAL ) 200 MG tablet Take 200 mg by mouth daily.   10/11/2023 at  7:30 AM   latanoprost  (XALATAN ) 0.005 % ophthalmic solution Place 1 drop into  both eyes at bedtime.   10/10/2023 Bedtime   losartan (COZAAR) 100 MG tablet Take 100 mg by mouth daily.   10/11/2023 at  7:30 AM   omeprazole (PRILOSEC) 40 MG capsule Take 40 mg by mouth daily.   10/11/2023 at  7:30 AM   tamsulosin  (FLOMAX ) 0.4 MG CAPS capsule Take 0.4 mg by mouth daily.   10/11/2023 at  7:30 AM    Current Facility-Administered Medications  Medication Dose Route Frequency Provider Last Rate Last Admin   bupivacaine  liposome (EXPAREL ) 1.3 % injection 266 mg  20 mL Infiltration Once Candyce Champagne, MD       cefTRIAXone  (ROCEPHIN ) 2 g in sodium chloride  0.9 % 100 mL IVPB  2 g Intravenous  On Call to OR Candyce Champagne, MD       Chlorhexidine  Gluconate Cloth 2 % PADS 6 each  6 each Topical Once Candyce Champagne, MD       And   Chlorhexidine  Gluconate Cloth 2 % PADS 6 each  6 each Topical Once Candyce Champagne, MD       dexamethasone  (DECADRON ) injection 4 mg  4 mg Intravenous On Call to OR Candyce Champagne, MD       lactated ringers  infusion   Intravenous Continuous Jake Mayers, MD 10 mL/hr at 10/11/23 0928 New Bag at 10/11/23 0928     No Known Allergies  BP 132/84   Pulse 72   Temp 98.5 F (36.9 C) (Oral)   Resp 18   Ht 6' (1.829 m)   Wt 79.8 kg   SpO2 97%   BMI 23.87 kg/m   Labs: No results found for this or any previous visit (from the past 48 hours).  Imaging / Studies: No results found.   Eddye Goodie, M.D., F.A.C.S. Gastrointestinal and Minimally Invasive Surgery [facility] [address] [place], [place] [address] [phone] Main / Paging  10/11/2023 10:26 AM    Eddye Goodie

## 2023-10-11 NOTE — Anesthesia Procedure Notes (Signed)
 Procedure Name: Intubation Date/Time: 10/11/2023 11:52 AM  Performed by: Elaina Graver, CRNAPre-anesthesia Checklist: Patient identified, Emergency Drugs available, Suction available and Patient being monitored Patient Re-evaluated:Patient Re-evaluated prior to induction Oxygen Delivery Method: Circle System Utilized Preoxygenation: Pre-oxygenation with 100% oxygen Induction Type: IV induction Ventilation: Mask ventilation without difficulty Laryngoscope Size: Mac and 4 Grade View: Grade II Tube type: Oral Number of attempts: 1 Airway Equipment and Method: Stylet Placement Confirmation: ETT inserted through vocal cords under direct vision, positive ETCO2 and breath sounds checked- equal and bilateral Secured at: 22 cm Tube secured with: Tape Dental Injury: Teeth and Oropharynx as per pre-operative assessment

## 2023-10-11 NOTE — Care Management CC44 (Signed)
 Condition Code 44 Documentation Completed  Patient Details  Name: SABIAN KUBA MRN: [id number] Date of Birth: [birth year]   Condition Code 44 given:  Yes Patient signature on Condition Code 44 notice:  Yes Documentation of 2 MD's agreement:  Yes Code 44 added to claim:  Yes    Naoma Bacca, RN 10/11/2023, 4:44 PM

## 2023-10-12 ENCOUNTER — Observation Stay (HOSPITAL_COMMUNITY)

## 2023-10-12 ENCOUNTER — Encounter (HOSPITAL_COMMUNITY): Payer: Self-pay | Admitting: Surgery

## 2023-10-12 DIAGNOSIS — G4733 Obstructive sleep apnea (adult) (pediatric): Secondary | ICD-10-CM | POA: Diagnosis present

## 2023-10-12 DIAGNOSIS — F419 Anxiety disorder, unspecified: Secondary | ICD-10-CM | POA: Insufficient documentation

## 2023-10-12 DIAGNOSIS — K440 Diaphragmatic hernia with obstruction, without gangrene: Secondary | ICD-10-CM | POA: Diagnosis not present

## 2023-10-12 DIAGNOSIS — F319 Bipolar disorder, unspecified: Secondary | ICD-10-CM | POA: Diagnosis present

## 2023-10-12 DIAGNOSIS — I10 Essential (primary) hypertension: Secondary | ICD-10-CM | POA: Diagnosis present

## 2023-10-12 DIAGNOSIS — K449 Diaphragmatic hernia without obstruction or gangrene: Secondary | ICD-10-CM | POA: Diagnosis not present

## 2023-10-12 DIAGNOSIS — H8110 Benign paroxysmal vertigo, unspecified ear: Secondary | ICD-10-CM | POA: Insufficient documentation

## 2023-10-12 DIAGNOSIS — G4700 Insomnia, unspecified: Secondary | ICD-10-CM | POA: Diagnosis present

## 2023-10-12 DIAGNOSIS — E7800 Pure hypercholesterolemia, unspecified: Secondary | ICD-10-CM | POA: Insufficient documentation

## 2023-10-12 LAB — SURGICAL PATHOLOGY

## 2023-10-12 MED ORDER — IOHEXOL 300 MG/ML  SOLN
200.0000 mL | Freq: Once | INTRAMUSCULAR | Status: AC | PRN
  Administered 2023-10-12: 200 mL via ORAL

## 2023-10-12 MED ORDER — FUROSEMIDE 10 MG/ML IJ SOLN
40.0000 mg | Freq: Once | INTRAMUSCULAR | Status: AC
Start: 2023-10-12 — End: 2023-10-12
  Administered 2023-10-12: 40 mg via INTRAVENOUS
  Filled 2023-10-12: qty 4

## 2023-10-12 NOTE — Progress Notes (Signed)
 10/12/2023  DONOVEN PETT [id number] 1945-10-30  CARE TEAM: PCP: Lanae Pinal, MD  Outpatient Care Team: Patient Care Team: Lanae Pinal, MD as PCP - General (Family Medicine) Candyce Champagne, MD as Consulting Physician (General Surgery) Felecia Hopper, MD as Consulting Physician (Gastroenterology)  Inpatient Treatment Team: Treatment Team:  Candyce Champagne, MD Lonney Robe, RN Skippy Dun, RRT Percilla Boys, LPN Maridee Shoemaker Threasa Flood, [facility]   Problem List:   Principal Problem:   Incarcerated hiatal hernia Active Problems:   Bipolar I disorder with depression [facility])   Essential hypertension   Insomnia   OSA (obstructive sleep apnea)   10/11/2023  POST-OPERATIVE DIAGNOSIS:  PARAESOPHAGEAL HIATAL HERNIA   PROCEDURE:  Procedure(s): REPAIR, HERNIA, PARAESOPHAGEAL, ROBOT-ASSISTED   1. ROBOTIC reduction of paraesophageal hiatal hernia 2. Type II mediastinal dissection. 2.5.  Bilateral diaphragm release 2.75.  Excision of distal esophageal lipoma 3. Primary repair of hiatal hernia over pledgets.  4. Anterior & posterior gastropexy. 5. Toupet (270 degree partial posterior x 4cm)  fundoplication 6. Mesh reinforcement with absorbable mesh   SURGEON:  Eddye Goodie, MD  OR FINDINGS:   Large paraesophageal hiatal hernia with 100% of the stomach and duodenal bulb with moderate volume omentum in the mediastinum.  There was a 13 x 12 cm hiatal defect.   Right and left crural component separation diaphragmatic releases were needed.  It is a primary repair over pledgets.  Mesh reinforcement was used with PhasixT Mesh: a knitted monofilament mesh scaffold using Poly-4-hydroxybutyrate (P4HB), a biologically derived, fully resorbable material   The patient has a Toupet (270 degree partial posterior x 4cm) fundoplication.  The patient has had anterior and posterior gastropexy.      Assessment [facility] Stay = 1 days) 1 Day Post-Op    Patient doing  relatively well so far    Plan:  Liquids for now.  Esophagram this morning.  If no leak or obstruction, advance to dysphagia 1 diet.  Drain in mediastinal chest.  Most likely can remove prior to discharge if tolerating dysphagia 1 diet and no leak or obstruction on esophagram.  Diuresis x 1.  Hypertension control.  Blood pressure somewhat soft so holding losartan and just doing amlodipine  only for now.  -monitor electrolytes & replace as needed  Keep K>4, Mg>2, Phos>3  -VTE prophylaxis- SCDs.  Anticoagulation prophyllaxis SQ as appropriate  -mobilize as tolerated to help recovery.  Enlist therapies in moderate/high risk patients as appropriate  I updated the patient's status to the patient and nurse  Recommendations were made.  Questions were answered.  They expressed understanding & appreciation.  -Disposition:  Disposition:  The patient is from: Home Anticipate discharge to:  Home Anticipated Date of Discharge is:  April 25,2025   Barriers to discharge:  Pending Clinical improvement (more likely than not)  Patient currently is  close to being medically stable  for discharge from the hospital from a surgery standpoint.      I reviewed last 24 h vitals and pain scores, last 48 h intake and output, last 24 h labs and trends, and last 24 h imaging results.  I have reviewed this patient's available data, including medical history, events of note, test results, etc as part of my evaluation.   A significant portion of that time was spent in counseling. Care during the described time interval was provided by me.  This care required moderate level of medical decision making.  10/12/2023    Subjective: (Chief complaint)  Feeling good.  No lightheadedness or dizziness.  Tolerating clear liquids.  Denies any nausea or pain  Objective:  Vital signs:  Vitals:   10/11/23 1758 10/11/23 2059 10/12/23 0136 10/12/23 0453  BP: 123/77 (!) 143/91 132/82 104/71  Pulse: 72  76 74 77  Resp: 16 18 18 18   Temp:  97.8 F (36.6 C) 97.9 F (36.6 C) 98.2 F (36.8 C)  TempSrc:  Oral Oral Oral  SpO2: 95% 97% 96% 93%  Weight:      Height:        Last BM Date : 10/09/23  Intake/Output   Yesterday:  04/24 0701 - 04/25 0700 In: 4374.2 [P.O.:840; I.V.:2684.2; IV Piggyback:850] Out: 2420 [Urine:2100; Drains:300; Blood:20] This shift:  No intake/output data recorded.  Bowel function:  Flatus: YES  BM:  No  Drain: Serosanguinous   Physical Exam:  General: Pt awake/alert in no acute distress Eyes: PERRL, normal EOM.  Sclera clear.  No icterus Neuro: CN II-XII intact w/o focal sensory/motor deficits. Lymph: No head/neck/groin lymphadenopathy Psych:  No delerium/psychosis/paranoia.  Oriented x 4 HENT: Normocephalic, Mucus membranes moist.  No thrush Neck: Supple, No tracheal deviation.  No obvious thyromegaly Chest: No pain to chest wall compression.  Good respiratory excursion.  No audible wheezing CV:  Pulses intact.  Regular rhythm.  No major extremity edema MS: Normal AROM mjr joints.  No obvious deformity  Abdomen: Soft.  Nondistended.  Nontender.  No evidence of peritonitis.  No incarcerated hernias.  Ext:   No deformity.  No mjr edema.  No cyanosis Skin: No petechiae / purpurea.  No major sores.  Warm and dry    Results:   Cultures: No results found for this or any previous visit (from the past 720 hours).  Labs: Results for orders placed or performed during the hospital encounter of 10/11/23 (from the past 48 hours)  Type and screen [facility]     Status: None   Collection Time: 10/11/23  9:54 AM  Result Value Ref Range   ABO/RH(D) A POS    Antibody Screen NEG    Sample Expiration      10/14/2023,2359 Performed at [facility], [address], [place], [place] [address]   ABO/Rh     Status: None   Collection Time: 10/11/23  9:54 AM  Result Value Ref Range   ABO/RH(D)      A POS Performed at  [facility], [address], [place], [place] [address]     Imaging / Studies: No results found.  Medications / Allergies: per chart  Antibiotics: Anti-infectives (From admission, onward)    Start     Dose/Rate Route Frequency Ordered Stop   10/11/23 0900  cefTRIAXone  (ROCEPHIN ) 2 g in sodium chloride  0.9 % 100 mL IVPB        2 g 200 mL/hr over 30 Minutes Intravenous On call to O.R. 10/11/23 0845 10/11/23 1224         Note: Portions of this report may have been transcribed using voice recognition software. Every effort was made to ensure accuracy; however, inadvertent computerized transcription errors may be present.   Any transcriptional errors that result from this process are unintentional.    Eddye Goodie, MD, FACS, MASCRS Esophageal, Gastrointestinal & Colorectal Surgery Robotic and Minimally Invasive Surgery  [facility] [address], [address] [place], [place] [address] [phone] Fax [phone] Main  CONTACT INFORMATION: Weekday (9AM-5PM): Call  [facility] main office at [phone] Weeknight (5PM-9AM) or Weekend/Holiday: Check EPIC "Web Links" tab & use "AMION" (password " [email or web address]") for General Surgery [facility] coverage  Please, DO NOT use SecureChat  (it is not reliable communication to reach operating surgeons & will lead to a delay in care).   Epic staff messaging available for outptient concerns needing 1-2 business day response.      10/12/2023  7:50 AM

## 2023-10-12 NOTE — Plan of Care (Signed)

## 2023-10-12 NOTE — Progress Notes (Signed)
   10/12/23 0939  TOC Brief Assessment  Patient has primary care physician Yes  Home environment has been reviewed Resides in private residence  Prior level of function: Independent  Prior/Current Home Services No current home services  Social Drivers of Health Review SDOH reviewed no interventions necessary  Readmission risk has been reviewed Yes  Transition of care needs no transition of care needs at this time

## 2023-10-13 DIAGNOSIS — A0472 Enterocolitis due to Clostridium difficile, not specified as recurrent: Secondary | ICD-10-CM | POA: Diagnosis not present

## 2023-10-13 DIAGNOSIS — R197 Diarrhea, unspecified: Secondary | ICD-10-CM | POA: Diagnosis not present

## 2023-10-15 NOTE — Discharge Summary (Signed)
 Physician Discharge Summary    Joseph Walls MRN: [id number] DOB/AGE: Apr 10, 1946 = 78 y.o.  Patient Care Team: Lanae Pinal, MD as PCP - General (Family Medicine) Candyce Champagne, MD as Consulting Physician (General Surgery) Felecia Hopper, MD as Consulting Physician (Gastroenterology)  Admit date: 10/11/2023  Discharge date: 10/12/2023 Hospital Stay = 0 days    Discharge Diagnoses:  Principal Problem:   Incarcerated hiatal hernia Active Problems:   Bipolar I disorder with depression (HCC)   Essential hypertension   Insomnia   OSA (obstructive sleep apnea)   4 Days Post-Op  10/11/2023  POST-OPERATIVE DIAGNOSIS:   PARAESOPHAGEAL HIATAL HERNIA  SURGERY:  10/11/2023  Procedure(s): REPAIR, HERNIA, PARAESOPHAGEAL, ROBOT-ASSISTED   1. ROBOTIC reduction of paraesophageal hiatal hernia 2. Type II mediastinal dissection. 2.5.  Bilateral diaphragm release 2.75.  Excision of distal esophageal lipoma 3. Primary repair of hiatal hernia over pledgets.  4. Anterior & posterior gastropexy. 5. Toupet (270 degree partial posterior x 4cm)  fundoplication 6. Mesh reinforcement with absorbable mesh  SURGEON:    Surgeon(s): Candyce Champagne, MD Melvenia Stabs, MD  Consults: Case Management / Social Work and Anesthesia  Hospital Course:   The patient underwent the surgery above.  Postoperatively, the patient gradually mobilized and advanced to a solid diet.  Pain and other symptoms were treated aggressively.    By the time of discharge, the patient was walking well the hallways, eating food, having flatus.  Pain was well-controlled on an oral medications.  Based on meeting discharge criteria and continuing to recover, I felt it was safe for the patient to be discharged from the hospital to further recover with close followup. Postoperative recommendations were discussed in detail.  They are written as well.  Discharged Condition: good  Discharge Exam: Blood pressure 104/71, pulse  77, temperature 98.2 F (36.8 C), temperature source Oral, resp. rate 18, height 6' (1.829 m), weight 79.8 kg, SpO2 93%.  General: Pt awake/alert/oriented x4 in No acute distress Eyes: PERRL, normal EOM.  Sclera clear.  No icterus Neuro: CN II-XII intact w/o focal sensory/motor deficits. Lymph: No head/neck/groin lymphadenopathy Psych:  No delerium/psychosis/paranoia HENT: Normocephalic, Mucus membranes moist.  No thrush Neck: Supple, No tracheal deviation Chest:  No chest wall pain w good excursion CV:  Pulses intact.  Regular rhythm MS: Normal AROM mjr joints.  No obvious deformity Abdomen: Soft.  Nondistended.  Mildly tender at incisions only  No evidence of peritonitis.  No incarcerated hernias. Ext:  SCDs BLE.  No mjr edema.  No cyanosis Skin: No petechiae / purpura   Disposition:    Follow-up Information     Candyce Champagne, MD Follow up on 10/29/2023.   Specialties: General Surgery, Colon and Rectal Surgery Contact information: [address] [place] [address] [phone]                 Discharge disposition: 01-Home or Self Care       Discharge Instructions     Call MD for:   Complete by: As directed    Temperature > 101.23F   Call MD for:  extreme fatigue   Complete by: As directed    Call MD for:  hives   Complete by: As directed    Call MD for:  persistant nausea and vomiting   Complete by: As directed    Call MD for:  redness, tenderness, or signs of infection (pain, swelling, redness, odor or green/yellow discharge around incision site)   Complete by: As directed  Call MD for:  severe uncontrolled pain   Complete by: As directed    Diet general   Complete by: As directed    SEE ESOPHAGEAL SURGERY DIET INSTRUCTIONS  We using usually start you out on a pureed (blenderized) diet. Expect some sticking with swallowing over the next 1-2 months.   This is due to swelling around your esophagus at the wrap & hiatal diaphragm  repair.  It will gradually ease off over the next few months.   Discharge instructions   Complete by: As directed    Please see discharge instruction sheets.   Also refer to any handouts/printouts that may have been given from the [facility] surgery office (if you visited us  there before surgery) Please call our office if you have any questions or concerns [phone]   Driving Restrictions   Complete by: As directed    No driving until off narcotics and can safely swerve away without pain during an emergency   Increase activity slowly   Complete by: As directed    Lifting restrictions   Complete by: As directed    Avoid heavy lifting initially, <20 pounds at first.   Do not push through pain.   You have no specific weight limit: If it hurts to do, DON'T DO IT.    If you feel no pain, you are not injuring anything.  Pain will protect you from injury.   Coughing and sneezing are far more stressful to your incision than any lifting.   Avoid resuming heavy lifting (>50 pounds) or other intense activity until off all narcotic pain medications.   When want to exercise more, give yourself 2 weeks to gradually get back to full intense exercise/activity.   May shower / Bathe   Complete by: As directed    SHOWER EVERY DAY.  It is fine for dressings or wounds to be washed/rinsed.  Use gentle soap & water.  This will help the incisions and/or wounds get clean & minimize infection.   May walk up steps   Complete by: As directed    Remove dressing in 48 hours   Complete by: As directed    Sexual Activity Restrictions   Complete by: As directed    Sexual activity as tolerated.  Do not push through pain.  Pain will protect you from injury.   Walk with assistance   Complete by: As directed    Walk over an hour a day.  May use a walker/cane/companion to help with balance and stamina.       Allergies as of 10/12/2023   No Known Allergies      Medication List     TAKE these medications     ALPRAZolam  0.25 MG tablet Commonly known as: XANAX  Take 0.25 mg by mouth daily as needed for anxiety.   amLODipine  10 MG tablet Commonly known as: NORVASC  Take 10 mg by mouth daily.   atorvastatin  20 MG tablet Commonly known as: LIPITOR Take 20 mg by mouth daily.   cyanocobalamin  500 MCG tablet Commonly known as: VITAMIN B12 Take 500 mcg by mouth daily.   dorzolamide -timolol  2-0.5 % ophthalmic solution Commonly known as: COSOPT  Place 1 drop into the left eye 2 (two) times daily.   ferrous sulfate 325 (65 FE) MG tablet Take 325 mg by mouth daily.   lamoTRIgine  200 MG tablet Commonly known as: LAMICTAL  Take 200 mg by mouth daily.   latanoprost  0.005 % ophthalmic solution Commonly known as: XALATAN  Place 1 drop into both  eyes at bedtime.   losartan 100 MG tablet Commonly known as: COZAAR Take 100 mg by mouth daily.   omeprazole 40 MG capsule Commonly known as: PRILOSEC Take 40 mg by mouth daily.   ondansetron  4 MG tablet Commonly known as: ZOFRAN  Take 1 tablet (4 mg total) by mouth every 8 (eight) hours as needed for nausea.   tamsulosin  0.4 MG Caps capsule Commonly known as: FLOMAX  Take 0.4 mg by mouth daily.   traMADol  50 MG tablet Commonly known as: ULTRAM  Take 1-2 tablets (50-100 mg total) by mouth every 6 (six) hours as needed for moderate pain (pain score 4-6) or severe pain (pain score 7-10).        Significant Diagnostic Studies:  No results found for this or any previous visit (from the past 72 hours).  DG ESOPHAGUS W SINGLE CM (SOL OR THIN BA) Result Date: 10/12/2023 CLINICAL DATA:  Provided history: Status post laparoscopic fundoplication. Additional history obtained from electronic MEDICAL RECORD NUMBERRobotic reduction of paraesophageal hiatal hernia, mediastinal dissection, bilateral diaphragm release, excision of distal esophageal lipoma, primary repair of hiatal hernia over pledgets, anterior and posterior gastropexy, Toupet fundoplication, mesh  reinforcement. EXAM: ESOPHOGRAM/BARIUM SWALLOW TECHNIQUE: A single contrast examination was performed using water soluble contrast. FLUOROSCOPY: Radiation Exposure Index (as provided by the fluoroscopic device): 61.10 mGy Kerma COMPARISON:  CT abdomen/pelvis 06/16/2023. FINDINGS: A problem-oriented water-soluble esophagram was performed to assess for post-operative leak or obstruction. No extraluminal contrast demonstrated to suggest a post-operative leak. Mildly delayed passage of contrast from the distal esophagus into the stomach, possibly due to post-operative edema. A surgical drain terminates in the region of the lower mediastinum/left upper quadrant of the abdomen. IMPRESSION: 1. No evidence of post-operative leak. 2. Mildly delayed passage contrast from the distal esophagus into the stomach, possibly due to post-operative edema. Electronically Signed   By: Bascom Lily D.O.   On: 10/12/2023 09:18    Past Medical History:  Diagnosis Date   Anxiety 10/12/2023   Bipolar disorder (HCC)    Essential hypertension 10/12/2023   History of hiatal hernia    Insomnia 10/12/2023   Melanoma (HCC)    Left mid back   OSA (obstructive sleep apnea) 10/12/2023    Past Surgical History:  Procedure Laterality Date   CATARACT EXTRACTION Bilateral    KNEE SURGERY Right    XI ROBOTIC ASSISTED PARAESOPHAGEAL HERNIA REPAIR N/A 10/11/2023   Procedure: REPAIR, HERNIA, PARAESOPHAGEAL, ROBOT-ASSISTED   1. ROBOTIC reduction of paraesophageal hiatal hernia 2. Type II mediastinal dissection. 2.5.  Bilateral diaphragm release 2.75.  Excision of distal esophageal lipoma 3. Primary repair of hiatal hernia over pledgets.  4. Anterior & posterior gastropexy. 5. Toupet (270 degree partial posterior x 4cm)  fundoplication 6. Mesh reinforcement wit    Social History   Socioeconomic History   Marital status: Single    Spouse name: Not on file   Number of children: Not on file   Years of education: Not on file   Highest  education level: Not on file  Occupational History   Not on file  Tobacco Use   Smoking status: Never    Passive exposure: Never   Smokeless tobacco: Never  Vaping Use   Vaping status: Never Used  Substance and Sexual Activity   Alcohol use: Yes    Comment: rarely   Drug use: Not Currently   Sexual activity: Not on file  Other Topics Concern   Not on file  Social History Narrative   Not on file  Social Drivers of Corporate investment banker Strain: Not on file  Food Insecurity: No Food Insecurity (10/11/2023)   Hunger Vital Sign    Worried About Running Out of Food in the Last Year: Never true    Ran Out of Food in the Last Year: Never true  Transportation Needs: No Transportation Needs (10/11/2023)   PRAPARE - Administrator, Civil Service (Medical): No    Lack of Transportation (Non-Medical): No  Physical Activity: Not on file  Stress: Not on file  Social Connections: Unknown (10/11/2023)   Social Connection and Isolation Panel [NHANES]    Frequency of Communication with Friends and Family: More than three times a week    Frequency of Social Gatherings with Friends and Family: More than three times a week    Attends Religious Services: Patient declined    Database administrator or Organizations: Patient declined    Attends Banker Meetings: Patient declined    Marital Status: Patient declined  Intimate Partner Violence: Not At Risk (10/11/2023)   Humiliation, Afraid, Rape, and Kick questionnaire    Fear of Current or Ex-Partner: No    Emotionally Abused: No    Physically Abused: No    Sexually Abused: No    History reviewed. No pertinent family history.  No current facility-administered medications for this encounter.   Current Outpatient Medications  Medication Sig Dispense Refill   ALPRAZolam  (XANAX ) 0.25 MG tablet Take 0.25 mg by mouth daily as needed for anxiety.     amLODipine  (NORVASC ) 10 MG tablet Take 10 mg by mouth daily.      atorvastatin  (LIPITOR) 20 MG tablet Take 20 mg by mouth daily.     cyanocobalamin  (VITAMIN B12) 500 MCG tablet Take 500 mcg by mouth daily.     dorzolamide -timolol  (COSOPT ) 2-0.5 % ophthalmic solution Place 1 drop into the left eye 2 (two) times daily.     ferrous sulfate 325 (65 FE) MG tablet Take 325 mg by mouth daily.     lamoTRIgine  (LAMICTAL ) 200 MG tablet Take 200 mg by mouth daily.     latanoprost  (XALATAN ) 0.005 % ophthalmic solution Place 1 drop into both eyes at bedtime.     losartan (COZAAR) 100 MG tablet Take 100 mg by mouth daily.     omeprazole (PRILOSEC) 40 MG capsule Take 40 mg by mouth daily.     ondansetron  (ZOFRAN ) 4 MG tablet Take 1 tablet (4 mg total) by mouth every 8 (eight) hours as needed for nausea. 10 tablet 20   tamsulosin  (FLOMAX ) 0.4 MG CAPS capsule Take 0.4 mg by mouth daily.     traMADol  (ULTRAM ) 50 MG tablet Take 1-2 tablets (50-100 mg total) by mouth every 6 (six) hours as needed for moderate pain (pain score 4-6) or severe pain (pain score 7-10). 20 tablet 0     No Known Allergies  Signed:   Eddye Goodie, MD, FACS, MASCRS Esophageal, Gastrointestinal & Colorectal Surgery Robotic and Minimally Invasive Surgery  [facility] [address] [place], [place] [address] [phone] Fax [phone] Main  CONTACT INFORMATION: Weekday (9AM-5PM): Call [facility] main office at [phone] Weeknight (5PM-9AM) or Weekend/Holiday: Check EPIC "Web Links" tab & use "AMION" (password " [email or web address]") for General Surgery [facility] coverage  Please, DO NOT use SecureChat  (it is not reliable communication to reach operating surgeons & will lead to a delay in care).   Epic staff messaging available  for outptient concerns needing 1-2 business day response.      10/15/2023, 8:53 AM

## 2023-10-23 DIAGNOSIS — Z9889 Other specified postprocedural states: Secondary | ICD-10-CM | POA: Diagnosis not present

## 2023-10-23 DIAGNOSIS — A0472 Enterocolitis due to Clostridium difficile, not specified as recurrent: Secondary | ICD-10-CM | POA: Diagnosis not present

## 2023-10-29 NOTE — Progress Notes (Signed)
 PROVIDER:  ELSPETH JUDAH SCHULTZE, MD  Patient Care Team: Regino Slater, MD as PCP - General (Family Medicine) Gross, ELSPETH JUDAH, MD as Consulting Provider (General Surgery) Elicia Claw, MD (Gastroenterology)  [facility] MRN: [id number] DOB: 1946/05/07 DATE OF ENCOUNTER: 10/29/2023    Interval History:   The patient returns to the office after undergoing robotic reduction of large incarcerated paraesophageal hiatal hernia with crural component separation releases and Phasix mesh reinforcement.  Excision of distal esophageal fatty mass.  Partial posterior Toupet fundoplication.  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  Hernia sac benign with benign lymph nodes.  Patient comes in for postop visit.  He went home postop day 2 on dysphagia 1 diet.  It has been 3 weeks since discharge.  He was having loose bowel movements on his liquid diet.  Had C. difficile study that showed colonization but not definite infection.  Some E. coli as well.  Seem to be physiologic but urgent care office placed him on a 10-day course of vancomycin.  He completed a lot.  He has been eating well doing most soft foods already.  Had a couple food sticking episodes but they were short-lived.  He is doing most regular day-to-day activities.  He does some upper body workout and core exercises at the gym.  Has not gotten back to that but wonders if that is safe.  No fevers or chills.  Moving his bowels every day.  In good spirits.      Labs, Imaging and Diagnostic Testing:  Located in 'Care Everywhere' section of Epic EMR chart   PRIOR [facility] NOTES:  Located in 'Care Everywhere' section of Epic EMR chart   SURGERY NOTES:    POST-OPERATIVE DIAGNOSIS:  PARAESOPHAGEAL HIATAL HERNIA   PROCEDURE:    1. ROBOTIC reduction of paraesophageal hiatal hernia 2. Type II mediastinal dissection. 2.5.  Bilateral diaphragm release 2.75.  Excision of distal esophageal  lipoma 3. Primary repair of hiatal hernia over pledgets.  4. Anterior & posterior gastropexy. 5. Toupet (270 degree partial posterior x 4cm)  fundoplication 6. Mesh reinforcement with absorbable mesh   SURGEON:  ELSPETH KYM SCHULTZE, MD   OR FINDINGS:   Large paraesophageal hiatal hernia with 100% of the stomach and duodenal bulb with moderate volume omentum in the mediastinum.  There was a 13 x 12 cm hiatal defect.   Right and left crural component separation diaphragmatic releases were needed.  It is a primary repair over pledgets.  Mesh reinforcement was used with PhasixT Mesh: a knitted monofilament mesh scaffold using Poly-4-hydroxybutyrate (P4HB), a biologically derived, fully resorbable material   The patient has a Toupet (270 degree partial posterior x 4cm) fundoplication.  The patient has had anterior and posterior gastropexy.  PATHOLOGY:  Located in 'Care Everywhere' section of Epic EMR chart     Physical Examination:   There is no height or weight on file to calculate BMI.   Constitutional: Not cachectic.  Hygeine adequate.   Eyes: Normal extraocular movements. Sclera nonicteric Neuro: No major focal sensory defects.  No major motor deficits. Psych:  No severe agitation.  No severe anxiety.  Judgment & insight Adequate, Oriented x4, HENT: Normocephalic, Mucus membranes moist.  No thrush.   Neck: Supple, No tracheal deviation.   Chest: Good respiratory excursion.  No audible wheezing CV:  No major extremity edema Ext: No obvious deformity or contracture.  Edema: not present.  No cyanosis Skin:  Warm and dry Musculoskeletal:   Mobility:  no assist device moving easily without restrictions  Abdomen: Incisions Clean & dry with normal healing ridge Nontender.  Soft.   Nondistended.    Gen:  Inguinal hernia: Not present.  Inguinal lymph nodes: without lymphadenopathy.    Rectal: (Deferred)       Assessment and Plan:   Joseph Walls is a 78 y.o. male recovering s/p  robotic reduction and repair of chronically incarcerated hiatal hernia with partial fundoplication.  Removal of distal esophageal mass consistent with benign lipoma.  There are no diagnoses linked to this encounter.   He is feeling great and is very appreciative care.  I am glad he is doing well.  Continue to follow esophageal surgery sheet and advance to more regular diet over the next month.  It is okay to get back to the gym.  I recommend he give himself 3 weeks to gradually increase his intensity to unrestricted activity.  I do not feel strongly he needs to be on any antiacid medications from my standpoint since he is over the first several weeks of surgery.  Continue as needed meds as needed.  Daily fiber bowel regimen.  He notes he thinks he has an appointment to see Dr. Elicia with [facility] & does not know why.  I doubt he needs repeat endoscopy at this point and maybe it was an old appointment.  Patient does not recall needing a colonoscopy.  I recommend he consider reaching out to them just to double check things.    Because he is doing well, I think it safe to follow-up as needed.  He was very appreciative care.  I am glad he is doing this well this soon.  Low threshold to call if he has further issues.  No follow-ups on file.   The plan was discussed in detail with the patient today, who expressed understanding & appreciation.  The patient has my contact information, and understands to call me with any additional questions or concerns in the interval.  I would be happy to see the patient back sooner if the need arises.  Of note, portions of this report may have been transcribed using voice recognition software. Every effort was made to ensure accuracy; however, inadvertent computerized transcription errors may be present.   Any transcriptional errors that result from this process are unintentional.   Elspeth KYM Schultze, MD, FACS, MASCRS Esophageal, Gastrointestinal & Colorectal  Surgery Robotic and Minimally Invasive Surgery  [facility]  [address] [place], [place] [address] [phone] Fax [phone] Main              10/29/2023

## 2023-11-17 DIAGNOSIS — F3130 Bipolar disorder, current episode depressed, mild or moderate severity, unspecified: Secondary | ICD-10-CM | POA: Diagnosis not present

## 2023-11-17 DIAGNOSIS — E7800 Pure hypercholesterolemia, unspecified: Secondary | ICD-10-CM | POA: Diagnosis not present

## 2023-11-17 DIAGNOSIS — I10 Essential (primary) hypertension: Secondary | ICD-10-CM | POA: Diagnosis not present

## 2023-12-05 DIAGNOSIS — H401131 Primary open-angle glaucoma, bilateral, mild stage: Secondary | ICD-10-CM | POA: Diagnosis not present

## 2023-12-17 DIAGNOSIS — E7800 Pure hypercholesterolemia, unspecified: Secondary | ICD-10-CM | POA: Diagnosis not present

## 2023-12-17 DIAGNOSIS — F3130 Bipolar disorder, current episode depressed, mild or moderate severity, unspecified: Secondary | ICD-10-CM | POA: Diagnosis not present

## 2023-12-17 DIAGNOSIS — I10 Essential (primary) hypertension: Secondary | ICD-10-CM | POA: Diagnosis not present

## 2023-12-19 DIAGNOSIS — R42 Dizziness and giddiness: Secondary | ICD-10-CM | POA: Diagnosis not present

## 2024-01-17 DIAGNOSIS — I10 Essential (primary) hypertension: Secondary | ICD-10-CM | POA: Diagnosis not present

## 2024-01-17 DIAGNOSIS — E7800 Pure hypercholesterolemia, unspecified: Secondary | ICD-10-CM | POA: Diagnosis not present

## 2024-01-17 DIAGNOSIS — F3130 Bipolar disorder, current episode depressed, mild or moderate severity, unspecified: Secondary | ICD-10-CM | POA: Diagnosis not present

## 2024-01-30 DIAGNOSIS — R42 Dizziness and giddiness: Secondary | ICD-10-CM | POA: Diagnosis not present

## 2024-01-30 DIAGNOSIS — R350 Frequency of micturition: Secondary | ICD-10-CM | POA: Diagnosis not present

## 2024-01-30 DIAGNOSIS — I10 Essential (primary) hypertension: Secondary | ICD-10-CM | POA: Diagnosis not present

## 2024-01-30 DIAGNOSIS — R5381 Other malaise: Secondary | ICD-10-CM | POA: Diagnosis not present

## 2024-02-17 DIAGNOSIS — I10 Essential (primary) hypertension: Secondary | ICD-10-CM | POA: Diagnosis not present

## 2024-02-17 DIAGNOSIS — E7800 Pure hypercholesterolemia, unspecified: Secondary | ICD-10-CM | POA: Diagnosis not present

## 2024-02-17 DIAGNOSIS — F3130 Bipolar disorder, current episode depressed, mild or moderate severity, unspecified: Secondary | ICD-10-CM | POA: Diagnosis not present

## 2024-02-29 DIAGNOSIS — M542 Cervicalgia: Secondary | ICD-10-CM | POA: Diagnosis not present

## 2024-03-04 DIAGNOSIS — M542 Cervicalgia: Secondary | ICD-10-CM | POA: Diagnosis not present

## 2024-03-31 DIAGNOSIS — R1030 Lower abdominal pain, unspecified: Secondary | ICD-10-CM | POA: Diagnosis not present

## 2024-05-20 DIAGNOSIS — L218 Other seborrheic dermatitis: Secondary | ICD-10-CM | POA: Diagnosis not present
